# Patient Record
Sex: Female | Born: 1980 | Race: Black or African American | Hispanic: No | Marital: Married | Smoking: Never smoker
Health system: Southern US, Community
[De-identification: ages and names within clinical notes are randomized; demographics above are authoritative.]

## PROBLEM LIST (undated history)

## (undated) DIAGNOSIS — Z8619 Personal history of other infectious and parasitic diseases: Secondary | ICD-10-CM

## (undated) DIAGNOSIS — G43909 Migraine, unspecified, not intractable, without status migrainosus: Secondary | ICD-10-CM

## (undated) DIAGNOSIS — D709 Neutropenia, unspecified: Secondary | ICD-10-CM

## (undated) HISTORY — DX: Personal history of other infectious and parasitic diseases: Z86.19

## (undated) HISTORY — DX: Neutropenia, unspecified: D70.9

## (undated) HISTORY — DX: Migraine, unspecified, not intractable, without status migrainosus: G43.909

---
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
## 2001-08-08 ENCOUNTER — Emergency Department (HOSPITAL_COMMUNITY): Admission: EM | Admit: 2001-08-08 | Discharge: 2001-08-08 | Payer: Self-pay | Admitting: Emergency Medicine

## 2003-09-17 ENCOUNTER — Other Ambulatory Visit: Admission: RE | Admit: 2003-09-17 | Discharge: 2003-09-17 | Payer: Self-pay | Admitting: Internal Medicine

## 2004-11-23 ENCOUNTER — Other Ambulatory Visit: Admission: RE | Admit: 2004-11-23 | Discharge: 2004-11-23 | Payer: Self-pay | Admitting: Internal Medicine

## 2005-11-24 ENCOUNTER — Other Ambulatory Visit: Admission: RE | Admit: 2005-11-24 | Discharge: 2005-11-24 | Payer: Self-pay | Admitting: Internal Medicine

## 2006-03-28 ENCOUNTER — Other Ambulatory Visit: Admission: RE | Admit: 2006-03-28 | Discharge: 2006-03-28 | Payer: Self-pay | Admitting: Obstetrics and Gynecology

## 2006-11-02 ENCOUNTER — Ambulatory Visit: Payer: Self-pay | Admitting: Obstetrics and Gynecology

## 2007-01-24 ENCOUNTER — Ambulatory Visit: Payer: Self-pay | Admitting: Obstetrics and Gynecology

## 2007-01-26 ENCOUNTER — Encounter: Payer: Self-pay | Admitting: Maternal & Fetal Medicine

## 2007-03-28 ENCOUNTER — Observation Stay: Payer: Self-pay | Admitting: Advanced Practice Midwife

## 2007-04-06 ENCOUNTER — Ambulatory Visit: Payer: Self-pay | Admitting: Obstetrics and Gynecology

## 2007-05-18 ENCOUNTER — Encounter: Payer: Self-pay | Admitting: Maternal & Fetal Medicine

## 2007-05-31 ENCOUNTER — Ambulatory Visit: Payer: Self-pay | Admitting: Obstetrics and Gynecology

## 2007-06-01 ENCOUNTER — Ambulatory Visit: Payer: Self-pay | Admitting: Obstetrics and Gynecology

## 2007-06-06 ENCOUNTER — Observation Stay: Payer: Self-pay | Admitting: Obstetrics and Gynecology

## 2007-06-13 ENCOUNTER — Observation Stay: Payer: Self-pay | Admitting: Obstetrics and Gynecology

## 2007-06-16 ENCOUNTER — Inpatient Hospital Stay: Payer: Self-pay | Admitting: Obstetrics and Gynecology

## 2007-10-04 ENCOUNTER — Ambulatory Visit: Payer: Self-pay | Admitting: Internal Medicine

## 2010-01-08 ENCOUNTER — Emergency Department (HOSPITAL_COMMUNITY): Admission: EM | Admit: 2010-01-08 | Discharge: 2010-01-08 | Payer: Self-pay | Admitting: Emergency Medicine

## 2010-08-29 LAB — URINALYSIS, ROUTINE W REFLEX MICROSCOPIC
Bilirubin Urine: NEGATIVE
Ketones, ur: NEGATIVE mg/dL
Protein, ur: NEGATIVE mg/dL
Specific Gravity, Urine: 1.031 — ABNORMAL HIGH (ref 1.005–1.030)

## 2010-08-29 LAB — CBC
HCT: 40.1 % (ref 36.0–46.0)
Hemoglobin: 14 g/dL (ref 12.0–15.0)
MCH: 30.2 pg (ref 26.0–34.0)
MCHC: 35 g/dL (ref 30.0–36.0)
Platelets: 204 10*3/uL (ref 150–400)
RBC: 4.64 MIL/uL (ref 3.87–5.11)
RDW: 12.7 % (ref 11.5–15.5)

## 2010-08-29 LAB — DIFFERENTIAL
Basophils Relative: 1 % (ref 0–1)
Eosinophils Relative: 0 % (ref 0–5)
Lymphocytes Relative: 42 % (ref 12–46)
Monocytes Absolute: 0.3 10*3/uL (ref 0.1–1.0)
Neutrophils Relative %: 49 % (ref 43–77)

## 2010-08-29 LAB — COMPREHENSIVE METABOLIC PANEL
AST: 23 U/L (ref 0–37)
Albumin: 4.4 g/dL (ref 3.5–5.2)
BUN: 13 mg/dL (ref 6–23)
Chloride: 109 mEq/L (ref 96–112)
Creatinine, Ser: 0.85 mg/dL (ref 0.4–1.2)
GFR calc Af Amer: >60 mL/min (ref >60)
GFR calc non Af Amer: >60 mL/min (ref >60)
Potassium: 4 mEq/L (ref 3.5–5.1)
Total Protein: 7.9 g/dL (ref 6.0–8.3)

## 2010-08-29 LAB — POCT PREGNANCY, URINE: Preg Test, Ur: NEGATIVE

## 2011-08-23 ENCOUNTER — Other Ambulatory Visit: Payer: Self-pay | Admitting: Obstetrics & Gynecology

## 2011-08-23 ENCOUNTER — Other Ambulatory Visit (HOSPITAL_COMMUNITY)
Admission: RE | Admit: 2011-08-23 | Discharge: 2011-08-23 | Disposition: A | Discharge status: Home / Self Care | Payer: BC Managed Care – PPO | Source: Ambulatory Visit | Attending: Obstetrics & Gynecology | Admitting: Obstetrics & Gynecology

## 2011-08-23 DIAGNOSIS — Z124 Encounter for screening for malignant neoplasm of cervix: Secondary | ICD-10-CM | POA: Insufficient documentation

## 2011-08-23 DIAGNOSIS — Z1159 Encounter for screening for other viral diseases: Secondary | ICD-10-CM | POA: Insufficient documentation

## 2014-06-10 ENCOUNTER — Ambulatory Visit (INDEPENDENT_AMBULATORY_CARE_PROVIDER_SITE_OTHER): Payer: BC Managed Care – PPO | Admitting: Podiatry

## 2014-06-10 ENCOUNTER — Encounter: Payer: Self-pay | Admitting: Podiatry

## 2014-06-10 VITALS — BP 115/76 | HR 66 | Ht 64.0 in | Wt 169.0 lb

## 2014-06-10 DIAGNOSIS — M7740 Metatarsalgia, unspecified foot: Secondary | ICD-10-CM

## 2014-06-10 DIAGNOSIS — M7742 Metatarsalgia, left foot: Secondary | ICD-10-CM

## 2014-06-10 DIAGNOSIS — M21612 Bunion of left foot: Secondary | ICD-10-CM

## 2014-06-10 DIAGNOSIS — M21969 Unspecified acquired deformity of unspecified lower leg: Secondary | ICD-10-CM

## 2014-06-10 DIAGNOSIS — M21619 Bunion of unspecified foot: Secondary | ICD-10-CM | POA: Insufficient documentation

## 2014-06-10 DIAGNOSIS — M2012 Hallux valgus (acquired), left foot: Secondary | ICD-10-CM

## 2014-06-10 DIAGNOSIS — M2010 Hallux valgus (acquired), unspecified foot: Secondary | ICD-10-CM | POA: Insufficient documentation

## 2014-06-10 NOTE — Progress Notes (Signed)
Subjective: 33 year old female presents complaining of left lateral mid foot area pain after running x 2 months. At work she has a Health and safety inspectordesk job.  Runs three times a week for 30 minutes.   Objective: Vascular: All pedal pulses are palpable. No edema or erythema noted. Dermatologic: Dry peeling skin on right plantar and in web spaces. Neurologic: All epicritic and tactile sensations grossly intact. 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 Elevated first ray left. Lateral weight shifting left. Functional hallux limitus left.  Radiographic examination noted of short first ray bilateral. Hallux valgus on left with enlarged medial eminence of the first ray left.  Anterior break in CYMA line bilateral.  Assessment: Metatarsus primus elevatus. Short first ray bilateral. HAV with bunion left. Lesser metatarsalgia left.   Plan:  Reviewed findings and available treatment options. Both feet casted for Orthotics.

## 2014-06-10 NOTE — Patient Instructions (Signed)
Seen for pain in left foot. Both feet casted for orthotics.

## 2014-08-12 ENCOUNTER — Ambulatory Visit: Payer: Self-pay | Admitting: Podiatry

## 2016-07-15 DIAGNOSIS — Z8619 Personal history of other infectious and parasitic diseases: Secondary | ICD-10-CM

## 2016-07-15 HISTORY — DX: Personal history of other infectious and parasitic diseases: Z86.19

## 2017-04-14 ENCOUNTER — Telehealth: Payer: Self-pay | Admitting: Hematology and Oncology

## 2017-04-14 NOTE — Telephone Encounter (Signed)
Spoke with patient regarding new patient appointment with Dr. Gweneth DimitriPerlov 11/6 @ 2 pm to arrive 1:30 pm. Patient given date/time/location of appointment.   Demographic and insurance information confirmed.

## 2017-04-19 ENCOUNTER — Ambulatory Visit (HOSPITAL_BASED_OUTPATIENT_CLINIC_OR_DEPARTMENT_OTHER): Payer: 59 | Admitting: Hematology and Oncology

## 2017-04-19 ENCOUNTER — Ambulatory Visit (HOSPITAL_BASED_OUTPATIENT_CLINIC_OR_DEPARTMENT_OTHER): Payer: 59

## 2017-04-19 ENCOUNTER — Encounter: Payer: Self-pay | Admitting: Hematology and Oncology

## 2017-04-19 ENCOUNTER — Telehealth: Payer: Self-pay | Admitting: Hematology and Oncology

## 2017-04-19 VITALS — BP 121/66 | HR 67 | Temp 98.7°F | Resp 20 | Ht 64.0 in | Wt 165.5 lb

## 2017-04-19 DIAGNOSIS — R51 Headache: Secondary | ICD-10-CM | POA: Diagnosis not present

## 2017-04-19 DIAGNOSIS — D72819 Decreased white blood cell count, unspecified: Secondary | ICD-10-CM

## 2017-04-19 DIAGNOSIS — H539 Unspecified visual disturbance: Secondary | ICD-10-CM

## 2017-04-19 DIAGNOSIS — R110 Nausea: Secondary | ICD-10-CM

## 2017-04-19 DIAGNOSIS — R5383 Other fatigue: Secondary | ICD-10-CM | POA: Diagnosis not present

## 2017-04-19 DIAGNOSIS — R630 Anorexia: Secondary | ICD-10-CM | POA: Diagnosis not present

## 2017-04-19 DIAGNOSIS — D709 Neutropenia, unspecified: Secondary | ICD-10-CM

## 2017-04-19 DIAGNOSIS — R21 Rash and other nonspecific skin eruption: Secondary | ICD-10-CM | POA: Diagnosis not present

## 2017-04-19 DIAGNOSIS — K5900 Constipation, unspecified: Secondary | ICD-10-CM

## 2017-04-19 LAB — CBC & DIFF AND RETIC
BASO%: 0 % (ref 0.0–2.0)
BASOS ABS: 0 10*3/uL (ref 0.0–0.1)
EOS ABS: 0 10*3/uL (ref 0.0–0.5)
EOS%: 0 % (ref 0.0–7.0)
HEMATOCRIT: 37.3 % (ref 34.8–46.6)
HEMOGLOBIN: 12.7 g/dL (ref 11.6–15.9)
Immature Retic Fract: 2.1 % (ref 1.60–10.00)
LYMPH%: 42.9 % (ref 14.0–49.7)
MCH: 29.2 pg (ref 25.1–34.0)
MCHC: 34 g/dL (ref 31.5–36.0)
MCV: 85.7 fL (ref 79.5–101.0)
MONO#: 0.2 10*3/uL (ref 0.1–0.9)
MONO%: 6 % (ref 0.0–14.0)
NEUT#: 2 10*3/uL (ref 1.5–6.5)
NEUT%: 51.1 % (ref 38.4–76.8)
Platelets: 192 10*3/uL (ref 145–400)
RBC: 4.35 10*6/uL (ref 3.70–5.45)
RDW: 12.4 % (ref 11.2–14.5)
Retic %: 1.37 % (ref 0.70–2.10)
Retic Ct Abs: 59.6 10*3/uL (ref 33.70–90.70)
WBC: 4 10*3/uL (ref 3.9–10.3)
lymph#: 1.7 10*3/uL (ref 0.9–3.3)

## 2017-04-19 LAB — MORPHOLOGY: PLT EST: ADEQUATE

## 2017-04-19 LAB — COMPREHENSIVE METABOLIC PANEL
ALBUMIN: 4.1 g/dL (ref 3.5–5.0)
ALK PHOS: 56 U/L (ref 40–150)
ALT: 11 U/L (ref 0–55)
ANION GAP: 5 meq/L (ref 3–11)
AST: 19 U/L (ref 5–34)
BILIRUBIN TOTAL: 0.38 mg/dL (ref 0.20–1.20)
BUN: 10.9 mg/dL (ref 7.0–26.0)
CALCIUM: 9.3 mg/dL (ref 8.4–10.4)
CO2: 28 meq/L (ref 22–29)
CREATININE: 0.9 mg/dL (ref 0.6–1.1)
Chloride: 106 mEq/L (ref 98–109)
Glucose: 106 mg/dl (ref 70–140)
Potassium: 3.3 mEq/L — ABNORMAL LOW (ref 3.5–5.1)
Sodium: 140 mEq/L (ref 136–145)
TOTAL PROTEIN: 7.6 g/dL (ref 6.4–8.3)

## 2017-04-19 LAB — LACTATE DEHYDROGENASE: LDH: 151 U/L (ref 125–245)

## 2017-04-19 NOTE — Telephone Encounter (Signed)
Scheduled appt pe r1/16 los - Gave patient AVS and calender per los.  

## 2017-04-20 LAB — ANCA TITERS

## 2017-04-20 LAB — RHEUMATOID FACTOR: RA Latex Turbid.: <10 IU/mL (ref 0.0–13.9)

## 2017-04-20 LAB — ANTI-DNA ANTIBODY, DOUBLE-STRANDED: dsDNA Ab: 2 IU/mL (ref 0–9)

## 2017-04-21 LAB — ANTINUCLEAR ANTIBODIES, IFA: ANTINUCLEAR ANTIBODIES, IFA: NEGATIVE

## 2017-04-27 ENCOUNTER — Other Ambulatory Visit: Payer: Self-pay

## 2017-04-27 ENCOUNTER — Telehealth: Payer: Self-pay | Admitting: Hematology and Oncology

## 2017-04-27 ENCOUNTER — Encounter: Payer: Self-pay | Admitting: Hematology and Oncology

## 2017-04-27 ENCOUNTER — Ambulatory Visit (HOSPITAL_BASED_OUTPATIENT_CLINIC_OR_DEPARTMENT_OTHER): Payer: 59 | Admitting: Hematology and Oncology

## 2017-04-27 VITALS — BP 125/73 | HR 58 | Temp 98.1°F | Resp 18 | Ht 64.0 in | Wt 163.6 lb

## 2017-04-27 DIAGNOSIS — R21 Rash and other nonspecific skin eruption: Secondary | ICD-10-CM | POA: Diagnosis not present

## 2017-04-27 DIAGNOSIS — D72819 Decreased white blood cell count, unspecified: Secondary | ICD-10-CM | POA: Diagnosis not present

## 2017-04-27 DIAGNOSIS — H539 Unspecified visual disturbance: Secondary | ICD-10-CM | POA: Diagnosis not present

## 2017-04-27 DIAGNOSIS — D709 Neutropenia, unspecified: Secondary | ICD-10-CM | POA: Diagnosis not present

## 2017-04-27 DIAGNOSIS — K5900 Constipation, unspecified: Secondary | ICD-10-CM | POA: Diagnosis not present

## 2017-04-27 DIAGNOSIS — R630 Anorexia: Secondary | ICD-10-CM

## 2017-04-27 DIAGNOSIS — R5383 Other fatigue: Secondary | ICD-10-CM

## 2017-04-27 DIAGNOSIS — R51 Headache: Secondary | ICD-10-CM

## 2017-04-27 DIAGNOSIS — R110 Nausea: Secondary | ICD-10-CM

## 2017-04-27 NOTE — Telephone Encounter (Signed)
Gave avs and calendar for February 2019 °

## 2017-04-28 NOTE — Progress Notes (Signed)
[facility] New Visit:  Assessment: Neutropenia [facility]) 36 y.o. 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  The exact nature of the decline and which branch of the blood counts is diminished is unclear at this time based on the available labs.  We will start our evaluation by repeating blood work today.  We will expand the obtain lab work to include testing for autoimmune conditions based on presence of cutaneous rash and history of eczema.  Plan:  --Repeat labs today --Return to clinic 1 week to review  Voice recognition software was used and creation of this note. Despite my best effort at editing the text, some misspelling/errors may have occurred.  Orders Placed This Encounter  Procedures  . CBC & Diff and Retic    Standing Status:   Future    Number of Occurrences:   1    Standing Expiration Date:   04/19/2018  . Morphology    Standing Status:   Future    Number of Occurrences:   1    Standing Expiration Date:   04/19/2018  . Lactate dehydrogenase (LDH)    Standing Status:   Future    Number of Occurrences:   1    Standing Expiration Date:   04/19/2018  . Comprehensive metabolic panel    Standing Status:   Future    Number of Occurrences:   1    Standing Expiration Date:   04/19/2018  . ANA, IFA (with reflex)    Standing Status:   Future    Number of Occurrences:   1    Standing Expiration Date:   04/19/2018  . Rheumatoid factor    Standing Status:   Future    Number of Occurrences:   1    Standing Expiration Date:   04/19/2018  . Anti-DNA antibody, double-stranded    Standing Status:   Future    Number of Occurrences:   1    Standing Expiration Date:   04/19/2018  . ANCA Titers    Standing Status:   Future    Number of Occurrences:   1    Standing Expiration Date:   04/19/2018    All questions were answered. . The patient knows to call the clinic  with any problems, questions or concerns.  This note was electronically signed.    History of Presenting Illness Michelle [name] 36 y.o. presenting to the [facility] for evaluation of leukopenia, referred by Dr. Deland Pretty.  Past medical history is significant for eczema and migraines.  Patient has multiple complaints including 3 of left-sided facial rash that has developed in February 2018 and was thought to represent shingles exacerbation.  Treated with prednisone and improved.  Patient reports some decrease in vision in the right eye, decreased appetite, fatigue, recurrent nausea and constipation with associated headaches.  Denies fevers, chills, night sweats.  No oral sores.  No known sun sensitivity or skin blistering.  Patient denies chest pain, shortness of breath or cough.  No abdominal pain.  Denies dysuria or hematuria.  Oncological/hematological History: --Labs, 01/08/10: WBC 3.0, ANC 1.5, ALC 1.3, Mono 0.3, Eos 0, Baso 0, Hgb 14.0,  Plt 204;  --Labs, 04/02/16: WBC 3.5,                                                                         Hgb 13.3, MCV 88.4, MCH 29.2, RDW 12.2, Plt 232; --Labs, 04/06/17: WBC 2.5,                                                                         Hgb 13.2, MCV 86.0, MCH 28.9, RDW 12.3, Plt 232;   Medical History: History reviewed. No pertinent past medical history.  Surgical History: Past Surgical History:  Procedure Laterality Date  . BREAST LUMPECTOMY  1994    Family History: History reviewed. No pertinent family history.  Social History: Social History   Socioeconomic History  . Marital status: Married    Spouse name: Not on file  . Number of children: Not on file  . Years of education: Not on file  . Highest education level: Not on file  Social Needs  . Financial resource strain: Not on file  . Food insecurity - worry: Not on file  . Food insecurity - inability:  Not on file  . Transportation needs - medical: Not on file  . Transportation needs - non-medical: Not on file  Occupational History  . Not on file  Tobacco Use  . Smoking status: Never Smoker  . Smokeless tobacco: Never Used  Substance and Sexual Activity  . Alcohol use: No    Alcohol/week: 0.0 oz    Frequency: Never  . Drug use: No  . Sexual activity: Yes    Birth control/protection: IUD  Other Topics Concern  . Not on file  Social History Narrative  . Not on file    Allergies: Allergies  Allergen Reactions  . Codeine     Medications:  No current outpatient medications on file.   No current facility-administered medications for this visit.     Review of Systems: Review of Systems  Constitutional: Positive for appetite change and fatigue.  Eyes: Positive for eye problems.  Gastrointestinal: Positive for constipation and nausea.  Skin: Positive for rash.  Neurological: Positive for headaches.  All other systems reviewed and are negative.    PHYSICAL EXAMINATION Blood pressure 121/66, pulse 67, temperature 98.7 F (37.1 C), temperature source Oral, resp. rate 20, height 5' 4"  (1.626 m), weight 165 lb 8 oz (75.1 kg), SpO2 100 %.  ECOG PERFORMANCE STATUS: 2 - Symptomatic, <50% confined to bed  Physical Exam  Constitutional: She is oriented to person, place, and time and well-developed, well-nourished, and in no distress. No distress.  HENT:  Head: Normocephalic and atraumatic.  Mouth/Throat: Oropharynx is clear and moist. No oropharyngeal exudate.  Eyes: Conjunctivae and EOM are normal. Pupils are equal, round, and reactive to light. No scleral icterus.  Neck: No thyromegaly present.  Cardiovascular: Normal rate and regular rhythm.  No murmur heard. Pulmonary/Chest: Effort normal and breath sounds normal. No respiratory distress. She has no wheezes.  She has no rales.  Abdominal: Soft. Bowel sounds are normal. She exhibits no distension. There is no tenderness.   Musculoskeletal: She exhibits no edema.  Lymphadenopathy:    She has no cervical adenopathy.  Neurological: She is alert and oriented to person, place, and time. She has normal reflexes. No cranial nerve deficit.  Skin: Skin is warm and dry. No rash noted. She is not diaphoretic.     LABORATORY DATA: I have personally reviewed the data as listed: Appointment on 04/19/2017  Component Date Value Ref Range Status  . Cytoplasmic (C-ANCA) 04/19/2017 <1:20  Neg:<1:20 titer Final  . Perinuclear (P-ANCA) 04/19/2017 <1:20  Neg:<1:20 titer Final   Comment: The presence of positive fluorescence exhibiting P-ANCA or C-ANCA patterns alone is not specific for the diagnosis of Wegener's Granulomatosis (WG) or microscopic polyangiitis. Decisions about treatment should not be based solely on ANCA IFA results.  The International ANCA Group Consensus recommends follow up testing of positive sera with both PR-3 and MPO-ANCA enzyme immunoassays. As many as 5% serum samples are positive only by EIA. Ref. AM J Clin Pathol 1999;111:507-513.   Marland Kitchen Atypical pANCA 04/19/2017 <1:20  Neg:<1:20 titer Final   Comment: The atypical pANCA pattern has been observed in a significant percentage of patients with ulcerative colitis, primary sclerosing cholangitis and autoimmune hepatitis.   Marland Kitchen dsDNA Ab 04/19/2017 2  0 - 9 IU/mL Final   Comment:                                                   Negative      <5                                                   Equivocal  5 - 9                                                   Positive      >9   . RA Latex Turbid. 04/19/2017 <10.0  0.0 - 13.9 IU/mL Final  . ANA Titer 1 04/19/2017 Negative   Final   Comment:                                                     Negative   <1:80                                                     Borderline  1:80  Positive   >1:80   . Sodium 04/19/2017 140  136 - 145 mEq/L Final  .  Potassium 04/19/2017 3.3* 3.5 - 5.1 mEq/L Final  . Chloride 04/19/2017 106  98 - 109 mEq/L Final  . CO2 04/19/2017 28  22 - 29 mEq/L Final  . Glucose 04/19/2017 106  70 - 140 mg/dl Final   Glucose reference range is for nonfasting patients. Fasting glucose reference range is 70- 100.  Marland Kitchen BUN 04/19/2017 10.9  7.0 - 26.0 mg/dL Final  . Creatinine 04/19/2017 0.9  0.6 - 1.1 mg/dL Final  . Total Bilirubin 04/19/2017 0.38  0.20 - 1.20 mg/dL Final  . Alkaline Phosphatase 04/19/2017 56  40 - 150 U/L Final  . AST 04/19/2017 19  5 - 34 U/L Final  . ALT 04/19/2017 11  0 - 55 U/L Final  . Total Protein 04/19/2017 7.6  6.4 - 8.3 g/dL Final  . Albumin 04/19/2017 4.1  3.5 - 5.0 g/dL Final  . Calcium 04/19/2017 9.3  8.4 - 10.4 mg/dL Final  . Anion Gap 04/19/2017 5  3 - 11 mEq/L Final  . EGFR 04/19/2017 >60  >60 ml/min/1.73 m2 Final   eGFR is calculated using the CKD-EPI Creatinine Equation (2009)  . LDH 04/19/2017 151  125 - 245 U/L Final  . Tear Drop Cells 04/19/2017 occ  Negative Final  . Ovalocytes 04/19/2017 occ  Negative Final  . White Cell Comments 04/19/2017 C/W auto diff   Final  . PLT EST 04/19/2017 Adequate  Adequate Final  . WBC 04/19/2017 4.0  3.9 - 10.3 10e3/uL Final  . NEUT# 04/19/2017 2.0  1.5 - 6.5 10e3/uL Final  . HGB 04/19/2017 12.7  11.6 - 15.9 g/dL Final  . HCT 04/19/2017 37.3  34.8 - 46.6 % Final  . Platelets 04/19/2017 192  145 - 400 10e3/uL Final  . MCV 04/19/2017 85.7  79.5 - 101.0 fL Final  . MCH 04/19/2017 29.2  25.1 - 34.0 pg Final  . MCHC 04/19/2017 34.0  31.5 - 36.0 g/dL Final  . RBC 04/19/2017 4.35  3.70 - 5.45 10e6/uL Final  . RDW 04/19/2017 12.4  11.2 - 14.5 % Final  . lymph# 04/19/2017 1.7  0.9 - 3.3 10e3/uL Final  . MONO# 04/19/2017 0.2  0.1 - 0.9 10e3/uL Final  . Eosinophils Absolute 04/19/2017 0.0  0.0 - 0.5 10e3/uL Final  . Basophils Absolute 04/19/2017 0.0  0.0 - 0.1 10e3/uL Final  . NEUT% 04/19/2017 51.1  38.4 - 76.8 % Final  . LYMPH% 04/19/2017 42.9  14.0 -  49.7 % Final  . MONO% 04/19/2017 6.0  0.0 - 14.0 % Final  . EOS% 04/19/2017 0.0  0.0 - 7.0 % Final  . BASO% 04/19/2017 0.0  0.0 - 2.0 % Final  . Retic % 04/19/2017 1.37  0.70 - 2.10 % Final  . Retic Ct Abs 04/19/2017 59.60  33.70 - 90.70 10e3/uL Final  . Immature Retic Fract 04/19/2017 2.10  1.60 - 10.00 % Final         Ardath Sax, MD

## 2017-04-28 NOTE — Assessment & Plan Note (Addendum)
36 y.o. 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  The exact nature of the decline and which branch of the blood counts is diminished is unclear at this time based on the available labs.  We will start our evaluation by repeating blood work today.  We will expand the obtain lab work to include testing for autoimmune conditions based on presence of cutaneous rash and history of eczema.  Plan:  --Repeat labs today --Return to clinic 1 week to review

## 2017-05-07 NOTE — Progress Notes (Signed)
[facility] Follow-up Visit:  Assessment: Neutropenia [facility]) 36 y.o. 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  The exact nature of the decline and which branch of the blood counts is diminished is unclear at this time based on the available labs.  Repeat blood work demonstrates normalization of the white blood cell count and neutrophil counts at this time.  Hemoglobin and platelet counts are also within normal ranges.  Initial panel reveals no evidence of autoimmune disease markers at this time.  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  Nevertheless, currently abnormalities are resolved and absence of additional hematological abnormalities, I do hesitate to proceed with any additional assessment.  Instead, we will initiate observation and proceed with bone marrow biopsy if red blood cell or platelet lineage show significant abnormality in the future  Plan:  --Return to clinic 3 months with labs for continued hematological monitoring  Voice recognition software was used and creation of this note. Despite my best effort at editing the text, some misspelling/errors may have occurred.  Orders Placed This Encounter  Procedures  . CBC with Differential    Standing Status:   Future    Standing Expiration Date:   04/27/2018   All questions were answered.  . The patient knows to call the clinic with any problems, questions or concerns.  This note was electronically signed.    History of Presenting Illness Michelle [name] is a 36 y.o. [name] followed in the [facility] for evaluation of leukopenia, referred by Dr. Deland Pretty.  Past medical history is significant for eczema and migraines.  Patient has multiple complaints including 3 of left-sided facial rash that has developed in February 2018 and was thought to  represent shingles exacerbation.  Treated with prednisone and improved.  Patient reports some decrease in vision in the right eye, decreased appetite, fatigue, recurrent nausea and constipation with associated headaches.  Denies fevers, chills, night sweats.  No oral sores.  No known sun sensitivity or skin blistering.  Patient denies chest pain, shortness of breath or cough.  No abdominal pain.  Denies dysuria or hematuria.  Oncological/hematological History: --Labs, 01/08/10: WBC 3.0, ANC 1.5, ALC 1.3, Mono 0.3, Eos 0, Baso 0, Hgb 14.0,                                                        Plt 204;  --Labs, 04/02/16: WBC 3.5,                                                                         Hgb 13.3, MCV 88.4, MCH 29.2, RDW 12.2, Plt 232; --Labs, 04/06/17: WBC 2.5,  Hgb 13.2, MCV 86.0, MCH 28.9, RDW 12.3, Plt 232; --Labs, 04/19/17: WBC 4.0, ANC 2.0, ALC 1.7, Mono 0.2, Eos 0, Baso 0, Hgb 12.7,                                                        Plt 192; LDH 151; ANA, RF, anti-dsDNA Ab, & ANCA negative.   Medical History: No past medical history on file.  Surgical History: Past Surgical History:  Procedure Laterality Date  . BREAST LUMPECTOMY  1994    Family History: No family history on file.  Social History: Social History   Socioeconomic History  . Marital status: Married    Spouse name: Not on file  . Number of children: Not on file  . Years of education: Not on file  . Highest education level: Not on file  Social Needs  . Financial resource strain: Not on file  . Food insecurity - worry: Not on file  . Food insecurity - inability: Not on file  . Transportation needs - medical: Not on file  . Transportation needs - non-medical: Not on file  Occupational History  . Not on file  Tobacco Use  . Smoking status: Never Smoker  . Smokeless tobacco: Never Used  Substance and Sexual Activity  .  Alcohol use: No    Alcohol/week: 0.0 oz    Frequency: Never  . Drug use: No  . Sexual activity: Yes    Birth control/protection: IUD  Other Topics Concern  . Not on file  Social History Narrative  . Not on file    Allergies: Allergies  Allergen Reactions  . Codeine     Medications:  No current outpatient medications on file.   No current facility-administered medications for this visit.     Review of Systems: Review of Systems  Constitutional: Positive for appetite change and fatigue.  Eyes: Positive for eye problems.  Gastrointestinal: Positive for constipation and nausea.  Skin: Positive for rash.  Neurological: Positive for headaches.  All other systems reviewed and are negative.    PHYSICAL EXAMINATION Blood pressure 125/73, pulse (!) 58, temperature 98.1 F (36.7 C), temperature source Oral, resp. rate 18, height 5' 4"  (1.626 m), weight 163 lb 9.6 oz (74.2 kg), SpO2 100 %.  ECOG PERFORMANCE STATUS: 0 - Asymptomatic  Physical Exam  Constitutional: She is oriented to person, place, and time and well-developed, well-nourished, and in no distress. No distress.  HENT:  Head: Normocephalic and atraumatic.  Mouth/Throat: Oropharynx is clear and moist. No oropharyngeal exudate.  Eyes: Conjunctivae and EOM are normal. Pupils are equal, round, and reactive to light. No scleral icterus.  Neck: No thyromegaly present.  Cardiovascular: Normal rate and regular rhythm.  No murmur heard. Pulmonary/Chest: Effort normal and breath sounds normal. No respiratory distress. She has no wheezes. She has no rales.  Abdominal: Soft. Bowel sounds are normal. She exhibits no distension. There is no tenderness.  Musculoskeletal: She exhibits no edema.  Lymphadenopathy:    She has no cervical adenopathy.  Neurological: She is alert and oriented to person, place, and time. She has normal reflexes. No cranial nerve deficit.  Skin: Skin is warm and dry. No rash noted. She is not  diaphoretic.     LABORATORY DATA: I have personally reviewed the data as listed: No visits with results  within 1 Week(s) from this visit.  Latest known visit with results is:  Appointment on 04/19/2017  Component Date Value Ref Range Status  . Cytoplasmic (C-ANCA) 04/19/2017 <1:20  Neg:<1:20 titer Final  . Perinuclear (P-ANCA) 04/19/2017 <1:20  Neg:<1:20 titer Final   Comment: The presence of positive fluorescence exhibiting P-ANCA or C-ANCA patterns alone is not specific for the diagnosis of Wegener's Granulomatosis (WG) or microscopic polyangiitis. Decisions about treatment should not be based solely on ANCA IFA results.  The International ANCA Group Consensus recommends follow up testing of positive sera with both PR-3 and MPO-ANCA enzyme immunoassays. As many as 5% serum samples are positive only by EIA. Ref. AM J Clin Pathol 1999;111:507-513.   Marland Kitchen Atypical pANCA 04/19/2017 <1:20  Neg:<1:20 titer Final   Comment: The atypical pANCA pattern has been observed in a significant percentage of patients with ulcerative colitis, primary sclerosing cholangitis and autoimmune hepatitis.   Marland Kitchen dsDNA Ab 04/19/2017 2  0 - 9 IU/mL Final   Comment:                                                   Negative      <5                                                   Equivocal  5 - 9                                                   Positive      >9   . RA Latex Turbid. 04/19/2017 <10.0  0.0 - 13.9 IU/mL Final  . ANA Titer 1 04/19/2017 Negative   Final   Comment:                                                     Negative   <1:80                                                     Borderline  1:80                                                     Positive   >1:80   . Sodium 04/19/2017 140  136 - 145 mEq/L Final  . Potassium 04/19/2017 3.3* 3.5 - 5.1 mEq/L Final  . Chloride 04/19/2017 106  98 - 109 mEq/L Final  . CO2 04/19/2017 28  22 - 29 mEq/L Final  . Glucose 04/19/2017 106  70 - 140 mg/dl  Final   Glucose reference range is for nonfasting patients.  Fasting glucose reference range is 70- 100.  Marland Kitchen BUN 04/19/2017 10.9  7.0 - 26.0 mg/dL Final  . Creatinine 04/19/2017 0.9  0.6 - 1.1 mg/dL Final  . Total Bilirubin 04/19/2017 0.38  0.20 - 1.20 mg/dL Final  . Alkaline Phosphatase 04/19/2017 56  40 - 150 U/L Final  . AST 04/19/2017 19  5 - 34 U/L Final  . ALT 04/19/2017 11  0 - 55 U/L Final  . Total Protein 04/19/2017 7.6  6.4 - 8.3 g/dL Final  . Albumin 04/19/2017 4.1  3.5 - 5.0 g/dL Final  . Calcium 04/19/2017 9.3  8.4 - 10.4 mg/dL Final  . Anion Gap 04/19/2017 5  3 - 11 mEq/L Final  . EGFR 04/19/2017 >60  >60 ml/min/1.73 m2 Final   eGFR is calculated using the CKD-EPI Creatinine Equation (2009)  . LDH 04/19/2017 151  125 - 245 U/L Final  . Tear Drop Cells 04/19/2017 occ  Negative Final  . Ovalocytes 04/19/2017 occ  Negative Final  . White Cell Comments 04/19/2017 C/W auto diff   Final  . PLT EST 04/19/2017 Adequate  Adequate Final  . WBC 04/19/2017 4.0  3.9 - 10.3 10e3/uL Final  . NEUT# 04/19/2017 2.0  1.5 - 6.5 10e3/uL Final  . HGB 04/19/2017 12.7  11.6 - 15.9 g/dL Final  . HCT 04/19/2017 37.3  34.8 - 46.6 % Final  . Platelets 04/19/2017 192  145 - 400 10e3/uL Final  . MCV 04/19/2017 85.7  79.5 - 101.0 fL Final  . MCH 04/19/2017 29.2  25.1 - 34.0 pg Final  . MCHC 04/19/2017 34.0  31.5 - 36.0 g/dL Final  . RBC 04/19/2017 4.35  3.70 - 5.45 10e6/uL Final  . RDW 04/19/2017 12.4  11.2 - 14.5 % Final  . lymph# 04/19/2017 1.7  0.9 - 3.3 10e3/uL Final  . MONO# 04/19/2017 0.2  0.1 - 0.9 10e3/uL Final  . Eosinophils Absolute 04/19/2017 0.0  0.0 - 0.5 10e3/uL Final  . Basophils Absolute 04/19/2017 0.0  0.0 - 0.1 10e3/uL Final  . NEUT% 04/19/2017 51.1  38.4 - 76.8 % Final  . LYMPH% 04/19/2017 42.9  14.0 - 49.7 % Final  . MONO% 04/19/2017 6.0  0.0 - 14.0 % Final  . EOS% 04/19/2017 0.0  0.0 - 7.0 % Final  . BASO% 04/19/2017 0.0  0.0 - 2.0 % Final  . Retic % 04/19/2017 1.37  0.70 -  2.10 % Final  . Retic Ct Abs 04/19/2017 59.60  33.70 - 90.70 10e3/uL Final  . Immature Retic Fract 04/19/2017 2.10  1.60 - 10.00 % Final       Ardath Sax, MD

## 2017-05-07 NOTE — Assessment & Plan Note (Signed)
36 y.o. 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  The exact nature of the decline and which branch of the blood counts is diminished is unclear at this time based on the available labs.  Repeat blood work demonstrates normalization of the white blood cell count and neutrophil counts at this time.  Hemoglobin and platelet counts are also within normal ranges.  Initial panel reveals no evidence of autoimmune disease markers at this time.  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  Nevertheless, currently abnormalities are resolved and absence of additional hematological abnormalities, I do hesitate to proceed with any additional assessment.  Instead, we will initiate observation and proceed with bone marrow biopsy if red blood cell or platelet lineage show significant abnormality in the future  Plan:  --Return to clinic 3 months with labs for continued hematological monitoring

## 2017-07-28 ENCOUNTER — Inpatient Hospital Stay: Payer: 59

## 2017-07-28 ENCOUNTER — Encounter: Payer: Self-pay | Admitting: Hematology and Oncology

## 2017-07-28 ENCOUNTER — Inpatient Hospital Stay: Payer: 59 | Attending: Hematology and Oncology | Admitting: Hematology and Oncology

## 2017-07-28 ENCOUNTER — Telehealth: Payer: Self-pay

## 2017-07-28 VITALS — BP 112/66 | HR 55 | Temp 98.9°F | Resp 18 | Ht 64.0 in | Wt 165.7 lb

## 2017-07-28 DIAGNOSIS — H02403 Unspecified ptosis of bilateral eyelids: Secondary | ICD-10-CM | POA: Insufficient documentation

## 2017-07-28 DIAGNOSIS — D709 Neutropenia, unspecified: Secondary | ICD-10-CM | POA: Diagnosis not present

## 2017-07-28 DIAGNOSIS — D72819 Decreased white blood cell count, unspecified: Secondary | ICD-10-CM | POA: Diagnosis present

## 2017-07-28 LAB — CBC WITH DIFFERENTIAL/PLATELET
BASOS ABS: 0 10*3/uL (ref 0.0–0.1)
Basophils Relative: 0 %
EOS ABS: 0 10*3/uL (ref 0.0–0.5)
Eosinophils Relative: 0 %
HCT: 38.7 % (ref 34.8–46.6)
Hemoglobin: 12.8 g/dL (ref 11.6–15.9)
LYMPHS ABS: 1.4 10*3/uL (ref 0.9–3.3)
LYMPHS PCT: 49 %
MCH: 29 pg (ref 25.1–34.0)
MCHC: 33.1 g/dL (ref 31.5–36.0)
MCV: 87.6 fL (ref 79.5–101.0)
MONO ABS: 0.3 10*3/uL (ref 0.1–0.9)
Monocytes Relative: 10 %
Neutro Abs: 1.2 10*3/uL — ABNORMAL LOW (ref 1.5–6.5)
Neutrophils Relative %: 41 %
PLATELETS: 208 10*3/uL (ref 145–400)
RBC: 4.42 MIL/uL (ref 3.70–5.45)
RDW: 12.5 % (ref 11.2–14.5)
WBC: 2.9 10*3/uL — AB (ref 3.9–10.3)

## 2017-07-28 NOTE — Progress Notes (Signed)
[facility] Cancer Follow-up Visit:  Assessment: Neutropenia [facility]) 37 y.o. 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  The exact nature of the decline and which branch of the blood counts is diminished is unclear at this time based on the available labs.  There has been interval decline in the neutrophil count with mild neutropenia observed on evaluation today.  No changes in the hemoglobin or platelet counts. 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  Patient does not appear to have any significant symptoms associated with neutropenia at this time.  Of interest, patient does have complaint of heaviness in bilateral eyelids, although without apparent fatigability.  This is concerning for possible early development of myasthenia gravis and needs to be further evaluated by appropriate specialist.  Plan:  -Consult neurology for evaluation for possible myasthenia gravis -Monthly labs to continue monitoring leukocyte count trajectory. - Return to clinic in 3 months for continued hematological monitoring.  Voice recognition software was used and creation of this note. Despite my best effort at editing the text, some misspelling/errors may have occurred.  Orders Placed This Encounter  Procedures  . CBC with Differential (Cancer Center Only)    Standing Status:   Standing    Number of Occurrences:   3    Standing Expiration Date:   07/28/2018  . CMP (Cancer Center only)    Standing Status:   Future    Standing Expiration Date:   07/28/2018  . Lactate dehydrogenase (LDH)    Standing Status:   Future    Standing Expiration Date:   07/28/2018  . Ambulatory referral to Neurology    Referral Priority:   Routine    Referral Type:   Consultation    Referral Reason:   Specialty Services Required    Requested Specialty:   Neurology    Number of Visits Requested:   1   All questions were answered.  . The patient knows to call the clinic with any problems, questions or concerns.  This note was electronically signed.    History of Presenting Illness Michelle [name] is a 37 y.o. female followed in the Cancer Center for evaluation of leukopenia, referred by Dr. Merri Brunette.  Past medical history is significant for eczema and migraines.   At this visit to the clinic, patient denies any previous complaints that she had during our last visit.  Her main complaints are generalized fatigue as well as difficulties with keeping her eyelids open.  The eyelids feel heavy and often droop down the impairing her vision.  This may occur intermittently throughout the day.  She has to blink her eyes multiple times to clear the vision.  Denies worsening of the problem towards the end of the day.  Denies other muscle weakness or involuntary muscle movements. No interval fever, chills, night sweats.  No interval infections.   Oncological/hematological History: --Labs, 01/08/10: WBC 3.0, ANC 1.5, ALC 1.3, Mono 0.3, Eos 0, Baso 0, Hgb 14.0,                                                        Plt 204;  --Labs, 04/02/16: WBC 3.5,  Hgb 13.3, MCV 88.4, MCH 29.2, RDW 12.2, Plt 232; --Labs, 04/06/17: WBC 2.5,                                                                         Hgb 13.2, MCV 86.0, MCH 28.9, RDW 12.3, Plt 232; --Labs, 04/19/17: WBC 4.0, ANC 2.0, ALC 1.7, Mono 0.2, Eos 0, Baso 0, Hgb 12.7,                                                        Plt 192; LDH 151; ANA, RF, anti-dsDNA Ab, & ANCA negative. --Labs, 07/28/17: WBC 2.9, ANC 1.2, ALC 1.4, Mono 0.3,             Hgb 12.8,              Plt 208;   Medical History: No past medical history on file.  Surgical History: Past Surgical History:  Procedure Laterality Date  . BREAST LUMPECTOMY  1994    Family  History: No family history on file.  Social History: Social History   Socioeconomic History  . Marital status: Married    Spouse name: Not on file  . Number of children: Not on file  . Years of education: Not on file  . Highest education level: Not on file  Social Needs  . Financial resource strain: Not on file  . Food insecurity - worry: Not on file  . Food insecurity - inability: Not on file  . Transportation needs - medical: Not on file  . Transportation needs - non-medical: Not on file  Occupational History  . Not on file  Tobacco Use  . Smoking status: Never Smoker  . Smokeless tobacco: Never Used  Substance and Sexual Activity  . Alcohol use: No    Alcohol/week: 0.0 oz    Frequency: Never  . Drug use: No  . Sexual activity: Yes    Birth control/protection: IUD  Other Topics Concern  . Not on file  Social History Narrative  . Not on file    Allergies: Allergies  Allergen Reactions  . Codeine     Medications:  No current outpatient medications on file.   No current facility-administered medications for this visit.     Review of Systems: Review of Systems  Constitutional: Positive for fatigue.  Eyes: Positive for eye problems.  All other systems reviewed and are negative.    PHYSICAL EXAMINATION Blood pressure 112/66, pulse (!) 55, temperature 98.9 F (37.2 C), temperature source Oral, resp. rate 18, height 5\' 4"  (1.626 m), weight 165 lb 11.2 oz (75.2 kg), SpO2 100 %.  ECOG PERFORMANCE STATUS: 1 - Symptomatic but completely ambulatory  Physical Exam  Constitutional: She is oriented to person, place, and time and well-developed, well-nourished, and in no distress. No distress.  HENT:  Head: Normocephalic and atraumatic.  Mouth/Throat: Oropharynx is clear and moist. No oropharyngeal exudate.  Eyes: Conjunctivae and EOM are normal. Pupils are equal, round, and reactive to light. No scleral icterus.  Neck: No thyromegaly present.  Cardiovascular:  Normal rate  and regular rhythm.  No murmur heard. Pulmonary/Chest: Effort normal and breath sounds normal. No respiratory distress. She has no wheezes. She has no rales.  Abdominal: Soft. Bowel sounds are normal. She exhibits no distension. There is no tenderness.  Musculoskeletal: She exhibits no edema.  Lymphadenopathy:    She has no cervical adenopathy.  Neurological: She is alert and oriented to person, place, and time. She has normal reflexes. No cranial nerve deficit.  Skin: Skin is warm and dry. No rash noted. She is not diaphoretic.     LABORATORY DATA: I have personally reviewed the data as listed: Appointment on 07/28/2017  Component Date Value Ref Range Status  . WBC 07/28/2017 2.9* 3.9 - 10.3 K/uL Final  . RBC 07/28/2017 4.42  3.70 - 5.45 MIL/uL Final  . Hemoglobin 07/28/2017 12.8  11.6 - 15.9 g/dL Final  . HCT 16/03/9603 38.7  34.8 - 46.6 % Final  . MCV 07/28/2017 87.6  79.5 - 101.0 fL Final  . MCH 07/28/2017 29.0  25.1 - 34.0 pg Final  . MCHC 07/28/2017 33.1  31.5 - 36.0 g/dL Final  . RDW 54/02/8118 12.5  11.2 - 14.5 % Final  . Platelets 07/28/2017 208  145 - 400 K/uL Final  . Neutrophils Relative % 07/28/2017 41  % Final  . Neutro Abs 07/28/2017 1.2* 1.5 - 6.5 K/uL Final  . Lymphocytes Relative 07/28/2017 49  % Final  . Lymphs Abs 07/28/2017 1.4  0.9 - 3.3 K/uL Final  . Monocytes Relative 07/28/2017 10  % Final  . Monocytes Absolute 07/28/2017 0.3  0.1 - 0.9 K/uL Final  . Eosinophils Relative 07/28/2017 0  % Final  . Eosinophils Absolute 07/28/2017 0.0  0.0 - 0.5 K/uL Final  . Basophils Relative 07/28/2017 0  % Final  . Basophils Absolute 07/28/2017 0.0  0.0 - 0.1 K/uL Final   Performed at [facility], [address] [place], [place] [address]       Daisy Blossom, MD

## 2017-07-28 NOTE — Telephone Encounter (Signed)
Printed avs and calender of upcoming appointment, and assured referral went [facility]. Per 2/14 los

## 2017-07-28 NOTE — Assessment & Plan Note (Signed)
37 y.o. 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  The exact nature of the decline and which branch of the blood counts is diminished is unclear at this time based on the available labs.  There has been interval decline in the neutrophil count with mild neutropenia observed on evaluation today.  No changes in the hemoglobin or platelet counts. 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  Patient does not appear to have any significant symptoms associated with neutropenia at this time.  Of interest, patient does have complaint of heaviness in bilateral eyelids, although without apparent fatigability.  This is concerning for possible early development of myasthenia gravis and needs to be further evaluated by appropriate specialist.  Plan:  -Consult neurology for evaluation for possible myasthenia gravis -Monthly labs to continue monitoring leukocyte count trajectory. - Return to clinic in 3 months for continued hematological monitoring.

## 2017-07-29 ENCOUNTER — Ambulatory Visit (INDEPENDENT_AMBULATORY_CARE_PROVIDER_SITE_OTHER): Payer: 59 | Admitting: Diagnostic Neuroimaging

## 2017-07-29 ENCOUNTER — Encounter: Payer: Self-pay | Admitting: Diagnostic Neuroimaging

## 2017-07-29 VITALS — BP 107/71 | HR 55 | Ht 64.0 in | Wt 166.4 lb

## 2017-07-29 DIAGNOSIS — R200 Anesthesia of skin: Secondary | ICD-10-CM | POA: Diagnosis not present

## 2017-07-29 DIAGNOSIS — R202 Paresthesia of skin: Secondary | ICD-10-CM | POA: Diagnosis not present

## 2017-07-29 DIAGNOSIS — H538 Other visual disturbances: Secondary | ICD-10-CM | POA: Diagnosis not present

## 2017-07-29 DIAGNOSIS — H02403 Unspecified ptosis of bilateral eyelids: Secondary | ICD-10-CM

## 2017-07-29 NOTE — Patient Instructions (Signed)
-   check labs  - if normal, then may check MRI

## 2017-07-29 NOTE — Progress Notes (Signed)
[facility]  PATIENT: Michelle [name] DOB: 1981/05/13  REFERRING CLINICIAN: Lebron Conners, M HISTORY FROM: patient  REASON FOR VISIT: new consult    HISTORICAL  CHIEF COMPLAINT:  Chief Complaint  Patient presents with  . Neutropenia    rm 7, New Pt, "problem with low WBC; heavy eyelids, blurred vision since Nov 2018; hx shingles of left side of face, left eye Feb 2018"    HISTORY OF PRESENT ILLNESS:   37 year old right-handed female here for evaluation of heavy eyelids and blurred vision.  Patient has history of idiopathic neutropenia/leukopenia.  Since October 2018 patient has noticed intermittent blurred vision, sometimes double vision, especially when she is tired or fatigued.  Sometimes she feels heaviness in her eyelids.  Patient had history of shingles affecting left and left face in February 2018 with full resolution of symptoms.  Patient denies any numbness, weakness, in arms or legs.  No speech or swallowing problems.  No shortness of breath.  Patient referred here for evaluate of possible myasthenia gravis.  REVIEW OF SYSTEMS: Full 14 system review of systems performed and negative with exception of: Blurred vision eye pain fatigue headache numbness allergies.  ALLERGIES: Allergies  Allergen Reactions  . Codeine Nausea And Vomiting    HOME MEDICATIONS: Outpatient Medications Prior to Visit  Medication Sig Dispense Refill  . EPINEPHrine 0.3 mg/0.3 mL IJ SOAJ injection INJECT AS DIRECTED AS NEEDED FOR SYSTEMIC ALLERGIC REACTION  1  . PROAIR HFA 108 (90 Base) MCG/ACT inhaler as needed.    Marland Kitchen UNABLE TO FIND Med Name: allergy injections weekly     No facility-administered medications prior to visit.     PAST MEDICAL HISTORY: Past Medical History:  Diagnosis Date  . History of shingles 07/2016   left side of face, left eye  . Migraines   . Neutropenia ([place])     PAST SURGICAL HISTORY: Past Surgical History:  Procedure Laterality Date  .  BREAST LUMPECTOMY Left 1994   benign    FAMILY HISTORY: Family History  Problem Relation Age of Onset  . Asthma Brother   . Heart disease Maternal Grandmother   . Heart disease Maternal Grandfather     SOCIAL HISTORY:  Social History   Socioeconomic History  . Marital status: Married    Spouse name: Iona Beard  . Number of children: 1  . Years of education: 43  . Highest education level: Not on file  Social Needs  . Financial resource strain: Not on file  . Food insecurity - worry: Not on file  . Food insecurity - inability: Not on file  . Transportation needs - medical: Not on file  . Transportation needs - non-medical: Not on file  Occupational History    Comment: Center for [employer]  Tobacco Use  . Smoking status: Never Smoker  . Smokeless tobacco: Never Used  Substance and Sexual Activity  . Alcohol use: Yes    Alcohol/week: 0.0 oz    Frequency: Never    Comment: 07/29/17  1-2 monthly  . Drug use: No  . Sexual activity: Yes    Birth control/protection: IUD  Other Topics Concern  . Not on file  Social History Narrative   Lives with husband, son   Caffeine- maybe 1 daily     PHYSICAL EXAM  GENERAL EXAM/CONSTITUTIONAL: Vitals:  Vitals:   07/29/17 0814  BP: 107/71  Pulse: (!) 55  Weight: 166 lb 6.4 oz (75.5 kg)  Height: 5' 4"  (1.626 m)     Body mass  index is 28.56 kg/m.  Visual Acuity Screening   Right eye Left eye Both eyes  Without correction:     With correction: 20/20 20/30   Comments: contacts    Patient is in no distress; well developed, nourished and groomed; neck is supple  CARDIOVASCULAR:  Examination of carotid arteries is normal; no carotid bruits  Regular rate and rhythm, no murmurs  Examination of peripheral vascular system by observation and palpation is normal  EYES:  Ophthalmoscopic exam of optic discs and posterior segments is normal; no papilledema or hemorrhages  MUSCULOSKELETAL:  Gait, strength, tone,  movements noted in Neurologic exam below  NEUROLOGIC: MENTAL STATUS:  No flowsheet data found.  awake, alert, oriented to person, place and time  recent and remote memory intact  normal attention and concentration  language fluent, comprehension intact, naming intact,   fund of knowledge appropriate  CRANIAL NERVE:   2nd - no papilledema on fundoscopic exam  2nd, 3rd, 4th, 6th - pupils equal and reactive to light, visual fields full to confrontation, extraocular muscles intact, no nystagmus  5th - facial sensation symmetric  7th - facial strength symmetric  8th - hearing intact  9th - palate elevates symmetrically, uvula midline  11th - shoulder shrug symmetric  12th - tongue protrusion midline  SUBJECTIVE BLURRED VISION ON EY MOVEMENT TESTING  MILD INCREASE IN BLURRED VISION AFTER 1 MINUTE UPGAZE  MILD BILATERAL PTOSIS AT END OF EXAM  MOTOR:   normal bulk and tone, full strength in the BUE, BLE  ABLE TO SIT / STAND X 10; NO INCREASE IN WEAKNESS AFTER EXERCISE  ABLE TO COUNT TO 15 IN 1 BREATH  SENSORY:   normal and symmetric to light touch, temperature, vibration  COORDINATION:   finger-nose-finger, fine finger movements normal  REFLEXES:   deep tendon reflexes present and symmetric  GAIT/STATION:   narrow based gait; romberg is negative    DIAGNOSTIC DATA (LABS, IMAGING, TESTING) - I reviewed patient records, labs, notes, testing and imaging myself where available.  Lab Results  Component Value Date   WBC 2.9 (L) 07/28/2017   HGB 12.8 07/28/2017   HCT 38.7 07/28/2017   MCV 87.6 07/28/2017   PLT 208 07/28/2017      Component Value Date/Time   NA 140 04/19/2017 1420   K 3.3 (L) 04/19/2017 1420   CL 109 01/08/2010 0900   CO2 28 04/19/2017 1420   GLUCOSE 106 04/19/2017 1420   BUN 10.9 04/19/2017 1420   CREATININE 0.9 04/19/2017 1420   CALCIUM 9.3 04/19/2017 1420   PROT 7.6 04/19/2017 1420   ALBUMIN 4.1 04/19/2017 1420   AST 19  04/19/2017 1420   ALT 11 04/19/2017 1420   ALKPHOS 56 04/19/2017 1420   BILITOT 0.38 04/19/2017 1420   GFRNONAA >60 01/08/2010 0900   GFRAA  01/08/2010 0900    >60        The eGFR has been calculated using the MDRD equation. This calculation has not been validated in all clinical situations. eGFR's persistently <60 mL/min signify possible Chronic Kidney Disease.   No results found for: CHOL, HDL, LDLCALC, LDLDIRECT, TRIG, CHOLHDL No results found for: HGBA1C No results found for: VITAMINB12 No results found for: TSH   01/08/10 CT head [I reviewed images myself and agree with interpretation. -VRP]  - Normal    ASSESSMENT AND PLAN  37 y.o. year old female here with new onset blurred vision, double vision, heavy eyelids, fluctuating symptoms since October 2018.  We will proceed with  further workup for neuromuscular disease (myasthenia gravis), myopathy, or other potential CNS causes.  Dx:  1. Ptosis of both eyelids   2. Blurred vision   3. Numbness and tingling in both hands      PLAN: - check labs - if normal, then may check MRI brain and orbits  Orders Placed This Encounter  Procedures  . Acetylcholine Receptor, Binding  . CK  . Aldolase  . TSH  . Vitamin B12  . Hemoglobin A1c   Return in about 6 months (around 01/26/2018).    Penni Bombard, MD 1/36/8599, 2:34 AM Certified in Neurology, Neurophysiology and Neuroimaging  [facility] [address], [place], [place] [address] [phone]

## 2017-08-01 ENCOUNTER — Other Ambulatory Visit: Payer: Self-pay | Admitting: Diagnostic Neuroimaging

## 2017-08-01 DIAGNOSIS — H532 Diplopia: Secondary | ICD-10-CM

## 2017-08-01 DIAGNOSIS — H02403 Unspecified ptosis of bilateral eyelids: Secondary | ICD-10-CM

## 2017-08-01 LAB — CK: Total CK: 123 U/L (ref 24–173)

## 2017-08-01 LAB — HEMOGLOBIN A1C
Est. average glucose Bld gHb Est-mCnc: 103 mg/dL
Hgb A1c MFr Bld: 5.2 % (ref 4.8–5.6)

## 2017-08-01 LAB — VITAMIN B12: VITAMIN B 12: 482 pg/mL (ref 232–1245)

## 2017-08-01 LAB — ACETYLCHOLINE RECEPTOR, BINDING: AChR Binding Ab, Serum: <0.03 nmol/L (ref 0.00–0.24)

## 2017-08-01 LAB — ALDOLASE: Aldolase: 4.8 U/L (ref 3.3–10.3)

## 2017-08-01 LAB — TSH: TSH: 1.26 u[IU]/mL (ref 0.450–4.500)

## 2017-08-01 NOTE — Progress Notes (Signed)
Labs normal. Will proceed with MRI brain and orbits. -VRP

## 2017-08-02 ENCOUNTER — Telehealth: Payer: Self-pay | Admitting: *Deleted

## 2017-08-02 NOTE — Telephone Encounter (Signed)
LVM informing patient she has normal labs.   Advised her Dr Marjory LiesPenumalli has ordered MRI brain and orbits next. She will get a call to schedule after insurance approves. Advised she'll get a call with results when available. Left number for any questions.

## 2017-08-25 ENCOUNTER — Inpatient Hospital Stay: Payer: 59 | Attending: Hematology and Oncology

## 2017-08-25 DIAGNOSIS — D709 Neutropenia, unspecified: Secondary | ICD-10-CM | POA: Diagnosis not present

## 2017-08-25 LAB — CBC WITH DIFFERENTIAL (CANCER CENTER ONLY)
Basophils Absolute: 0.1 10*3/uL (ref 0.0–0.1)
Basophils Relative: 2 %
EOS ABS: 0 10*3/uL (ref 0.0–0.5)
Eosinophils Relative: 0 %
HCT: 39.7 % (ref 34.8–46.6)
Hemoglobin: 13.4 g/dL (ref 11.6–15.9)
LYMPHS ABS: 1.5 10*3/uL (ref 0.9–3.3)
Lymphocytes Relative: 49 %
MCH: 29 pg (ref 25.1–34.0)
MCHC: 33.7 g/dL (ref 31.5–36.0)
MCV: 86 fL (ref 79.5–101.0)
Monocytes Absolute: 0.2 10*3/uL (ref 0.1–0.9)
Monocytes Relative: 8 %
NEUTROS PCT: 41 %
Neutro Abs: 1.3 10*3/uL — ABNORMAL LOW (ref 1.5–6.5)
Platelet Count: 201 10*3/uL (ref 145–400)
RBC: 4.61 MIL/uL (ref 3.70–5.45)
RDW: 12.8 % (ref 11.2–14.5)
WBC Count: 3.1 10*3/uL — ABNORMAL LOW (ref 3.9–10.3)

## 2017-09-28 ENCOUNTER — Inpatient Hospital Stay: Payer: 59 | Attending: Hematology and Oncology

## 2017-09-28 DIAGNOSIS — D709 Neutropenia, unspecified: Secondary | ICD-10-CM | POA: Insufficient documentation

## 2017-09-28 LAB — CBC WITH DIFFERENTIAL (CANCER CENTER ONLY)
BASOS PCT: 0 %
Basophils Absolute: 0 10*3/uL (ref 0.0–0.1)
EOS ABS: 0 10*3/uL (ref 0.0–0.5)
Eosinophils Relative: 0 %
HCT: 39.8 % (ref 34.8–46.6)
Hemoglobin: 13.6 g/dL (ref 11.6–15.9)
Lymphocytes Relative: 51 %
Lymphs Abs: 1.2 10*3/uL (ref 0.9–3.3)
MCH: 29.4 pg (ref 25.1–34.0)
MCHC: 34.2 g/dL (ref 31.5–36.0)
MCV: 86.1 fL (ref 79.5–101.0)
MONO ABS: 0.3 10*3/uL (ref 0.1–0.9)
MONOS PCT: 11 %
Neutro Abs: 0.9 10*3/uL — ABNORMAL LOW (ref 1.5–6.5)
Neutrophils Relative %: 38 %
Platelet Count: 207 10*3/uL (ref 145–400)
RBC: 4.62 MIL/uL (ref 3.70–5.45)
RDW: 12.5 % (ref 11.2–14.5)
WBC Count: 2.4 10*3/uL — ABNORMAL LOW (ref 3.9–10.3)

## 2017-09-29 ENCOUNTER — Other Ambulatory Visit: Payer: 59

## 2017-10-27 ENCOUNTER — Inpatient Hospital Stay: Payer: 59

## 2017-10-27 ENCOUNTER — Telehealth: Payer: Self-pay

## 2017-10-27 ENCOUNTER — Inpatient Hospital Stay: Payer: 59 | Attending: Hematology and Oncology | Admitting: Hematology and Oncology

## 2017-10-27 VITALS — BP 106/71 | HR 57 | Temp 98.9°F | Resp 18 | Ht 64.0 in | Wt 164.2 lb

## 2017-10-27 DIAGNOSIS — D709 Neutropenia, unspecified: Secondary | ICD-10-CM

## 2017-10-27 DIAGNOSIS — D72819 Decreased white blood cell count, unspecified: Secondary | ICD-10-CM | POA: Insufficient documentation

## 2017-10-27 LAB — CBC WITH DIFFERENTIAL (CANCER CENTER ONLY)
Basophils Absolute: 0 10*3/uL (ref 0.0–0.1)
Basophils Relative: 0 %
Eosinophils Absolute: 0 10*3/uL (ref 0.0–0.5)
Eosinophils Relative: 0 %
HEMATOCRIT: 40.6 % (ref 34.8–46.6)
HEMOGLOBIN: 13.6 g/dL (ref 11.6–15.9)
LYMPHS ABS: 1.3 10*3/uL (ref 0.9–3.3)
LYMPHS PCT: 43 %
MCH: 29.2 pg (ref 25.1–34.0)
MCHC: 33.5 g/dL (ref 31.5–36.0)
MCV: 87.3 fL (ref 79.5–101.0)
MONO ABS: 0.3 10*3/uL (ref 0.1–0.9)
MONOS PCT: 12 %
NEUTROS ABS: 1.3 10*3/uL — AB (ref 1.5–6.5)
Neutrophils Relative %: 45 %
Platelet Count: 239 10*3/uL (ref 145–400)
RBC: 4.65 MIL/uL (ref 3.70–5.45)
RDW: 12.6 % (ref 11.2–14.5)
WBC Count: 2.9 10*3/uL — ABNORMAL LOW (ref 3.9–10.3)

## 2017-10-27 LAB — CMP (CANCER CENTER ONLY)
ALBUMIN: 4.3 g/dL (ref 3.5–5.0)
ALK PHOS: 59 U/L (ref 40–150)
ALT: 13 U/L (ref 0–55)
AST: 18 U/L (ref 5–34)
Anion gap: 7 (ref 3–11)
BUN: 10 mg/dL (ref 7–26)
CHLORIDE: 109 mmol/L (ref 98–109)
CO2: 24 mmol/L (ref 22–29)
Calcium: 9.4 mg/dL (ref 8.4–10.4)
Creatinine: 0.9 mg/dL (ref 0.60–1.10)
GFR, Est AFR Am: >60 mL/min (ref >60)
GFR, Estimated: >60 mL/min (ref >60)
GLUCOSE: 83 mg/dL (ref 70–140)
POTASSIUM: 4.8 mmol/L (ref 3.5–5.1)
SODIUM: 140 mmol/L (ref 136–145)
Total Bilirubin: 0.4 mg/dL (ref 0.2–1.2)
Total Protein: 7.8 g/dL (ref 6.4–8.3)

## 2017-10-27 LAB — LACTATE DEHYDROGENASE: LDH: 139 U/L (ref 125–245)

## 2017-10-27 NOTE — Telephone Encounter (Signed)
Printed avs and calender of upcoming appointment. Per 5/16 los 

## 2017-11-11 ENCOUNTER — Encounter: Payer: Self-pay | Admitting: Hematology and Oncology

## 2017-11-11 NOTE — Assessment & Plan Note (Signed)
37 y.o. 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  The exact nature of the decline and which branch of the blood counts is diminished is unclear at this time based on the available labs.  There has been interval decline in the neutrophil count with mild neutropenia observed on evaluation today.  No changes in the hemoglobin or platelet counts. 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  Patient does not appear to have any significant symptoms associated with neutropenia at this time.  Continued monitoring reveals intermittent oscillating pattern of neutropenia ranging between mild to moderate deficiency.  No significant changes in hemoglobin or platelets.  Plan:  -Continue observation - Return to clinic in 6 months for continued hematological monitoring.

## 2017-11-11 NOTE — Progress Notes (Signed)
[facility] Follow-up Visit:  Assessment: Neutropenia [facility]) 37 y.o. 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  The exact nature of the decline and which branch of the blood counts is diminished is unclear at this time based on the available labs.  There has been interval decline in the neutrophil count with mild neutropenia observed on evaluation today.  No changes in the hemoglobin or platelet counts. 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  Patient does not appear to have any significant symptoms associated with neutropenia at this time.  Continued monitoring reveals intermittent oscillating pattern of neutropenia ranging between mild to moderate deficiency.  No significant changes in hemoglobin or platelets.  Plan:  -Continue observation - Return to clinic in 6 months for continued hematological monitoring.  Voice recognition software was used and creation of this note. Despite my best effort at editing the text, some misspelling/errors may have occurred.  Orders Placed This Encounter  Procedures  . CBC with Differential (Cancer Center Only)    Standing Status:   Future    Standing Expiration Date:   10/28/2018  . CMP ([place] only)    Standing Status:   Future    Standing Expiration Date:   10/28/2018  . Lactate dehydrogenase (LDH)    Standing Status:   Future    Standing Expiration Date:   10/27/2018   All questions were answered.  . The patient knows to call the clinic with any problems, questions or concerns.  This note was electronically signed.    History of Presenting Illness Michelle [name] is a 37 y.o. female followed in the [place] for evaluation of leukopenia, referred by Dr. Deland Pretty.  Past medical history is significant for eczema and migraines.   Patient continues  observation in our clinic.  Since last visit to the clinic, patient had a single upper respiratory tract viral infection for which she has recovered completely.  In the interim, has been evaluated by neurology for possible diagnosis of myasthenia gravis.  Evaluation is presently ongoing.  Patient denies any new symptoms since last visit to the clinic.   Oncological/hematological History: --Labs, 01/08/10: WBC 3.0, ANC 1.5, ALC 1.3, Mono 0.3, Eos 0, Baso 0,  Hgb 14.0,                                                         Plt 204;  --Labs, 04/02/16: WBC 3.5,                                                                          Hgb 13.3, MCV 88.4, MCH 29.2, RDW 12.2,  Plt 232; --Labs, 04/06/17: WBC 2.5,  Hgb 13.2, MCV 86.0, MCH 28.9, RDW 12.3,  Plt 232; --Labs, 04/19/17: WBC 4.0, ANC 2.0, ALC 1.7, Mono 0.2, Eos 0, Baso 0,  Hgb 12.7,                                                         Plt 192; LDH 151; ANA, RF, anti-dsDNA Ab, & ANCA negative. --Labs, 07/28/17: WBC 2.9, ANC 1.2, ALC 1.4, Mono 0.3,              Hgb 12.8,               Plt 208; --Labs, 08/25/17: WBC 3.1, ANC 1.3, ALC 1.5, Mono 0.2,   Hgb 13.4,      Plt 201 --Labs, 09/28/17: WBC 2.4, ANC 0.9, ALC 1.2, Mono 0.3,   Hgb 13.6,     Plt 207 --Labs, 10/27/17: WBC 2.9, ANC 1.3, ALC 1.3, Mono 0.3,   Hgb 13.6,     Plt 239    Medical History: Past Medical History:  Diagnosis Date  . History of shingles 07/2016   left side of face, left eye  . Migraines   . Neutropenia [facility])     Surgical History: Past Surgical History:  Procedure Laterality Date  . BREAST LUMPECTOMY Left 1994   benign    Family History: Family History  Problem Relation Age of Onset  . Asthma Brother   . Heart disease Maternal Grandmother   . Heart disease Maternal Grandfather     Social History: Social History   Socioeconomic History  . Marital status: Married    Spouse name:  Iona Beard  . Number of children: 1  . Years of education: 74  . Highest education level: Not on file  Occupational History    Comment: [employer]  Social Needs  . Financial resource strain: Not on file  . Food insecurity:    Worry: Not on file    Inability: Not on file  . Transportation needs:    Medical: Not on file    Non-medical: Not on file  Tobacco Use  . Smoking status: Never Smoker  . Smokeless tobacco: Never Used  Substance and Sexual Activity  . Alcohol use: Yes    Alcohol/week: 0.0 oz    Frequency: Never    Comment: 07/29/17  1-2 monthly  . Drug use: No  . Sexual activity: Yes    Birth control/protection: IUD  Lifestyle  . Physical activity:    Days per week: Not on file    Minutes per session: Not on file  . Stress: Not on file  Relationships  . Social connections:    Talks on phone: Not on file    Gets together: Not on file    Attends religious service: Not on file    Active member of club or organization: Not on file    Attends meetings of clubs or organizations: Not on file    Relationship status: Not on file  . Intimate partner violence:    Fear of current or ex partner: Not on file    Emotionally abused: Not on file    Physically abused: Not on file    Forced sexual activity: Not on file  Other Topics Concern  . Not on file  Social History Narrative   Lives with husband, son  Caffeine- maybe 1 daily    Allergies: Allergies  Allergen Reactions  . Codeine Nausea And Vomiting    Medications:  Current Outpatient Medications  Medication Sig Dispense Refill  . EPINEPHrine 0.3 mg/0.3 mL IJ SOAJ injection INJECT AS DIRECTED AS NEEDED FOR SYSTEMIC ALLERGIC REACTION  1  . PROAIR HFA 108 (90 Base) MCG/ACT inhaler as needed.    Marland Kitchen UNABLE TO FIND Med Name: allergy injections weekly     No current facility-administered medications for this visit.     Review of Systems: Review of Systems  Constitutional: Positive for fatigue.   Eyes: Positive for eye problems.  All other systems reviewed and are negative.    PHYSICAL EXAMINATION Blood pressure 106/71, pulse (!) 57, temperature 98.9 F (37.2 C), temperature source Oral, resp. rate 18, height _0  (1.626 m), weight 164 lb 3.2 oz (74.5 kg), SpO2 100 %.  ECOG PERFORMANCE STATUS: 1 - Symptomatic but completely ambulatory  Physical Exam  Constitutional: She is oriented to person, place, and time and well-developed, well-nourished, and in no distress. No distress.  HENT:  Head: Normocephalic and atraumatic.  Mouth/Throat: Oropharynx is clear and moist. No oropharyngeal exudate.  Eyes: Pupils are equal, round, and reactive to light. Conjunctivae and EOM are normal. No scleral icterus.  Neck: No thyromegaly present.  Cardiovascular: Normal rate and regular rhythm.  No murmur heard. Pulmonary/Chest: Effort normal and breath sounds normal. No respiratory distress. She has no wheezes. She has no rales.  Abdominal: Soft. Bowel sounds are normal. She exhibits no distension. There is no tenderness.  Musculoskeletal: She exhibits no edema.  Lymphadenopathy:    She has no cervical adenopathy.  Neurological: She is alert and oriented to person, place, and time. She has normal reflexes. No cranial nerve deficit.  Skin: Skin is warm and dry. No rash noted. She is not diaphoretic.     LABORATORY DATA: I have personally reviewed the data as listed: Appointment on 10/27/2017  Component Date Value Ref Range Status  . LDH 10/27/2017 139  125 - 245 U/L Final   Performed at [facility], [place] [address] [place], [place] [address]  . Sodium 10/27/2017 140  136 - 145 mmol/L Final  . Potassium 10/27/2017 4.8  3.5 - 5.1 mmol/L Final  . Chloride 10/27/2017 109  98 - 109 mmol/L Final  . CO2 10/27/2017 24  22 - 29 mmol/L Final  . Glucose, Bld 10/27/2017 83  70 - 140 mg/dL Final  . BUN 10/27/2017 10  7 - 26 mg/dL Final  . Creatinine 10/27/2017 0.90  0.60 -  1.10 mg/dL Final  . Calcium 10/27/2017 9.4  8.4 - 10.4 mg/dL Final  . Total Protein 10/27/2017 7.8  6.4 - 8.3 g/dL Final  . Albumin 10/27/2017 4.3  3.5 - 5.0 g/dL Final  . AST 10/27/2017 18  5 - 34 U/L Final  . ALT 10/27/2017 13  0 - 55 U/L Final  . Alkaline Phosphatase 10/27/2017 59  40 - 150 U/L Final  . Total Bilirubin 10/27/2017 0.4  0.2 - 1.2 mg/dL Final  . GFR, Est Non Af Am 10/27/2017 >60  >60 mL/min Final  . GFR, Est AFR Am 10/27/2017 >60  >60 mL/min Final   Comment: (NOTE) The eGFR has been calculated using the CKD EPI equation. This calculation has not been validated in all clinical situations. eGFR's persistently <60 mL/min signify possible Chronic Kidney Disease.   . Anion gap 10/27/2017 7  3 - 11 Final  Performed at [facility], [place] [address] [place], [place] [address]  . WBC Count 10/27/2017 2.9* 3.9 - 10.3 K/uL Final  . RBC 10/27/2017 4.65  3.70 - 5.45 MIL/uL Final  . Hemoglobin 10/27/2017 13.6  11.6 - 15.9 g/dL Final  . HCT 10/27/2017 40.6  34.8 - 46.6 % Final  . MCV 10/27/2017 87.3  79.5 - 101.0 fL Final  . MCH 10/27/2017 29.2  25.1 - 34.0 pg Final  . MCHC 10/27/2017 33.5  31.5 - 36.0 g/dL Final  . RDW 10/27/2017 12.6  11.2 - 14.5 % Final  . Platelet Count 10/27/2017 239  145 - 400 K/uL Final  . Neutrophils Relative % 10/27/2017 45  % Final  . Neutro Abs 10/27/2017 1.3* 1.5 - 6.5 K/uL Final  . Lymphocytes Relative 10/27/2017 43  % Final  . Lymphs Abs 10/27/2017 1.3  0.9 - 3.3 K/uL Final  . Monocytes Relative 10/27/2017 12  % Final  . Monocytes Absolute 10/27/2017 0.3  0.1 - 0.9 K/uL Final  . Eosinophils Relative 10/27/2017 0  % Final  . Eosinophils Absolute 10/27/2017 0.0  0.0 - 0.5 K/uL Final  . Basophils Relative 10/27/2017 0  % Final  . Basophils Absolute 10/27/2017 0.0  0.0 - 0.1 K/uL Final   Performed at [facility], [place] [address] [place], [place] [address]       Ardath Sax, MD

## 2018-01-31 ENCOUNTER — Ambulatory Visit: Payer: 59 | Admitting: Diagnostic Neuroimaging

## 2018-04-20 ENCOUNTER — Telehealth: Payer: Self-pay | Admitting: Hematology

## 2018-04-20 NOTE — Telephone Encounter (Signed)
Out 11/14 - per [facility] move lab/fu four weeks out. Left message re lab/fu 12/12. Schedule mailed.

## 2018-04-27 ENCOUNTER — Other Ambulatory Visit: Payer: 59

## 2018-04-27 ENCOUNTER — Ambulatory Visit: Payer: 59 | Admitting: Hematology

## 2018-05-24 ENCOUNTER — Inpatient Hospital Stay: Payer: 59 | Attending: Hematology

## 2018-05-24 ENCOUNTER — Inpatient Hospital Stay (HOSPITAL_BASED_OUTPATIENT_CLINIC_OR_DEPARTMENT_OTHER): Payer: 59 | Admitting: Hematology

## 2018-05-24 VITALS — BP 118/71 | HR 54 | Temp 98.7°F | Resp 18 | Ht 64.0 in | Wt 173.1 lb

## 2018-05-24 DIAGNOSIS — Z79899 Other long term (current) drug therapy: Secondary | ICD-10-CM | POA: Insufficient documentation

## 2018-05-24 DIAGNOSIS — D709 Neutropenia, unspecified: Secondary | ICD-10-CM

## 2018-05-24 LAB — CMP (CANCER CENTER ONLY)
ALT: 10 U/L (ref 0–44)
AST: 18 U/L (ref 15–41)
Albumin: 4.3 g/dL (ref 3.5–5.0)
Alkaline Phosphatase: 55 U/L (ref 38–126)
Anion gap: 8 (ref 5–15)
BILIRUBIN TOTAL: 0.4 mg/dL (ref 0.3–1.2)
BUN: 13 mg/dL (ref 6–20)
CHLORIDE: 106 mmol/L (ref 98–111)
CO2: 26 mmol/L (ref 22–32)
Calcium: 9.6 mg/dL (ref 8.9–10.3)
Creatinine: 0.83 mg/dL (ref 0.44–1.00)
GLUCOSE: 95 mg/dL (ref 70–99)
Potassium: 3.9 mmol/L (ref 3.5–5.1)
Sodium: 140 mmol/L (ref 135–145)
Total Protein: 7.8 g/dL (ref 6.5–8.1)

## 2018-05-24 LAB — CBC WITH DIFFERENTIAL (CANCER CENTER ONLY)
Abs Immature Granulocytes: 0 10*3/uL (ref 0.00–0.07)
BASOS PCT: 0 %
Basophils Absolute: 0 10*3/uL (ref 0.0–0.1)
EOS PCT: 0 %
Eosinophils Absolute: 0 10*3/uL (ref 0.0–0.5)
HEMATOCRIT: 40.2 % (ref 36.0–46.0)
HEMOGLOBIN: 13.1 g/dL (ref 12.0–15.0)
IMMATURE GRANULOCYTES: 0 %
LYMPHS ABS: 1.3 10*3/uL (ref 0.7–4.0)
LYMPHS PCT: 36 %
MCH: 28.6 pg (ref 26.0–34.0)
MCHC: 32.6 g/dL (ref 30.0–36.0)
MCV: 87.8 fL (ref 80.0–100.0)
Monocytes Absolute: 0.3 10*3/uL (ref 0.1–1.0)
Monocytes Relative: 8 %
NRBC: 0 % (ref 0.0–0.2)
Neutro Abs: 2 10*3/uL (ref 1.7–7.7)
Neutrophils Relative %: 56 %
PLATELETS: 223 10*3/uL (ref 150–400)
RBC: 4.58 MIL/uL (ref 3.87–5.11)
RDW: 12.1 % (ref 11.5–15.5)
WBC Count: 3.5 10*3/uL — ABNORMAL LOW (ref 4.0–10.5)

## 2018-05-24 LAB — LACTATE DEHYDROGENASE: LDH: 254 U/L — ABNORMAL HIGH (ref 98–192)

## 2018-05-24 NOTE — Progress Notes (Signed)
HEMATOLOGY/ONCOLOGY CONSULTATION NOTE  Date of Service: 05/24/2018  Patient Care Team: Thayer Headings, MD as PCP - General (Internal Medicine)  CHIEF COMPLAINTS/PURPOSE OF CONSULTATION:  Neutropenia   HISTORY OF PRESENTING ILLNESS:  Michelle [name] is a wonderful 37 y.o. female who has been previously seen by my colleague Dr. Milinda Antis for evaluation and management of Neutropenia. The patient last saw Dr. Gweneth Dimitri on 10/27/17. The pt reports that she is doing well overall.   The pt reports that she has not had any concerns of recurrent infections or frequent infections. The pt denies any fevers, chills, night sweats, unexpected weight loss, or new fatigue.  The pt notes that she had shingles a year ago, and that this affected her right eyelid, for which she is now using eye drops. She is also now taking Vitamin D replacement but denies any other medication changes.   She denies any recent trauma. The pt had an allergy shot in her upper left arm a couple days ago. She received her annual flu vaccine in September.   Most recent lab results (05/24/18) of CBC w/diff and CMP is as follows: all values are WNL except for WBC at 3.5k. 05/24/18 LDH at 254  On review of systems, pt reports stable energy levels, and denies fevers, chills, night sweats, unexpected weight loss, mouth sores, concerns for infections, abdominal pains, noticing any new lumps or bumps, leg swelling, and any other symptoms.   MEDICAL HISTORY:  Past Medical History:  Diagnosis Date  . History of shingles 07/2016   left side of face, left eye  . Migraines   . Neutropenia (HCC)     SURGICAL HISTORY: Past Surgical History:  Procedure Laterality Date  . BREAST LUMPECTOMY Left 1994   benign    SOCIAL HISTORY: Social History   Socioeconomic History  . Marital status: Married    Spouse name: Greggory Stallion  . Number of children: 1  . Years of education: 49  . Highest education level: Not on file  Occupational  History    Comment: [employer]  Social Needs  . Financial resource strain: Not on file  . Food insecurity:    Worry: Not on file    Inability: Not on file  . Transportation needs:    Medical: Not on file    Non-medical: Not on file  Tobacco Use  . Smoking status: Never Smoker  . Smokeless tobacco: Never Used  Substance and Sexual Activity  . Alcohol use: Yes    Alcohol/week: 0.0 standard drinks    Frequency: Never    Comment: 07/29/17  1-2 monthly  . Drug use: No  . Sexual activity: Yes    Birth control/protection: IUD  Lifestyle  . Physical activity:    Days per week: Not on file    Minutes per session: Not on file  . Stress: Not on file  Relationships  . Social connections:    Talks on phone: Not on file    Gets together: Not on file    Attends religious service: Not on file    Active member of club or organization: Not on file    Attends meetings of clubs or organizations: Not on file    Relationship status: Not on file  . Intimate partner violence:    Fear of current or ex partner: Not on file    Emotionally abused: Not on file    Physically abused: Not on file    Forced sexual activity: Not on file  Other Topics Concern  . Not on file  Social History Narrative   Lives with husband, son   Caffeine- maybe 1 daily    FAMILY HISTORY: Family History  Problem Relation Age of Onset  . Asthma Brother   . Heart disease Maternal Grandmother   . Heart disease Maternal Grandfather     ALLERGIES:  is allergic to codeine.  MEDICATIONS:  Current Outpatient Medications  Medication Sig Dispense Refill  . EPINEPHrine 0.3 mg/0.3 mL IJ SOAJ injection INJECT AS DIRECTED AS NEEDED FOR SYSTEMIC ALLERGIC REACTION  1  . PROAIR HFA 108 (90 Base) MCG/ACT inhaler as needed.    Marland Kitchen UNABLE TO FIND Med Name: allergy injections weekly     No current facility-administered medications for this visit.     REVIEW OF SYSTEMS:    10 Point review of Systems was done  is negative except as noted above.  PHYSICAL EXAMINATION:  . Vitals:   05/24/18 1454  BP: 118/71  Pulse: (!) 54  Resp: 18  Temp: 98.7 F (37.1 C)  SpO2: 100%   Filed Weights   05/24/18 1454  Weight: 173 lb 1.6 oz (78.5 kg)   .Body mass index is 29.71 kg/m.  GENERAL:alert, in no acute distress and comfortable SKIN: no acute rashes, no significant lesions EYES: conjunctiva are pink and non-injected, sclera anicteric OROPHARYNX: MMM, no exudates, no oropharyngeal erythema or ulceration NECK: supple, no JVD LYMPH:  no palpable lymphadenopathy in the cervical, axillary or inguinal regions LUNGS: clear to auscultation b/l with normal respiratory effort HEART: regular rate & rhythm ABDOMEN:  normoactive bowel sounds , non tender, not distended. Extremity: no pedal edema PSYCH: alert & oriented x 3 with fluent speech NEURO: no focal motor/sensory deficits  LABORATORY DATA:  I have reviewed the data as listed  . CBC Latest Ref Rng & Units 05/24/2018 10/27/2017 09/28/2017  WBC 4.0 - 10.5 K/uL 3.5(L) 2.9(L) 2.4(L)  Hemoglobin 12.0 - 15.0 g/dL 16.1 09.6 04.5  Hematocrit 36.0 - 46.0 % 40.2 40.6 39.8  Platelets 150 - 400 K/uL 223 239 207   . CBC    Component Value Date/Time   WBC 3.5 (L) 05/24/2018 1415   WBC 2.9 (L) 07/28/2017 0828   RBC 4.58 05/24/2018 1415   HGB 13.1 05/24/2018 1415   HGB 12.7 04/19/2017 1420   HCT 40.2 05/24/2018 1415   HCT 37.3 04/19/2017 1420   PLT 223 05/24/2018 1415   PLT 192 04/19/2017 1420   MCV 87.8 05/24/2018 1415   MCV 85.7 04/19/2017 1420   MCH 28.6 05/24/2018 1415   MCHC 32.6 05/24/2018 1415   RDW 12.1 05/24/2018 1415   RDW 12.4 04/19/2017 1420   LYMPHSABS 1.3 05/24/2018 1415   LYMPHSABS 1.7 04/19/2017 1420   MONOABS 0.3 05/24/2018 1415   MONOABS 0.2 04/19/2017 1420   EOSABS 0.0 05/24/2018 1415   EOSABS 0.0 04/19/2017 1420   BASOSABS 0.0 05/24/2018 1415   BASOSABS 0.0 04/19/2017 1420     . CMP Latest Ref Rng & Units 05/24/2018  10/27/2017 04/19/2017  Glucose 70 - 99 mg/dL 95 83 409  BUN 6 - 20 mg/dL 13 10 81.1  Creatinine 0.44 - 1.00 mg/dL 9.14 7.82 0.9  Sodium 956 - 145 mmol/L 140 140 140  Potassium 3.5 - 5.1 mmol/L 3.9 4.8 3.3(L)  Chloride 98 - 111 mmol/L 106 109 -  CO2 22 - 32 mmol/L 26 24 28   Calcium 8.9 - 10.3 mg/dL 9.6 9.4 9.3  Total Protein 6.5 - 8.1 g/dL 7.8  7.8 7.6  Total Bilirubin 0.3 - 1.2 mg/dL 0.4 0.4 0.980.38  Alkaline Phos 38 - 126 U/L 55 59 56  AST 15 - 41 U/L 18 18 19   ALT 0 - 44 U/L 10 13 11      RADIOGRAPHIC STUDIES: I have personally reviewed the radiological images as listed and agreed with the findings in the report. No results found.  ASSESSMENT & PLAN:  37 y.o. female with  1. Neutropenia -Discussed patient's most recent labs from 05/24/18, WBC improved to 3.5k, no anemia, PLT normal at 223k, ANC normalized to 2.0k -Suspect that elevated LDH is reactive to recent IM allergy shots that the pt received a couple days ago -Pt has not had recurrent or frequent infections, she has not had any constitutional symptoms -Previous neutropenia could have been due to a passing viral infection that suppressed her counts, but the fact that her ANC has normalized is reassuring against a concerning progressive pathologic process or primary bone marrow disorder  -Could also be an element of benign ethnic neutropenia -If patient develops progressively worsening counts, would have been an indication for further evaluation -Recommend that the patient continue follow up with her PCP and will be happy to see her back if there are any new concerns     RTC with Dr Candise Che as needed   All of the patients questions were answered with apparent satisfaction. The patient knows to call the clinic with any problems, questions or concerns.  The total time spent in the appt was 20 minutes and more than 50% was on counseling and direct patient cares.    Wyvonnia Lora  MD MS AAHIVMS [facility] Hematology/Oncology  Physician [facility]  (Office):       [phone] (Work cell):  [phone] (Fax):           [phone]  05/24/2018 3:35 PM  I, Marcelline MatesSchuyler Bain, am acting as a scribe for Dr. Wyvonnia Lora .   .I have reviewed the above documentation for accuracy and completeness, and I agree with the above. Johney Maine. Kishore  MD

## 2018-05-25 ENCOUNTER — Telehealth: Payer: Self-pay | Admitting: Hematology

## 2018-05-25 NOTE — Telephone Encounter (Signed)
Per 12/11 return as needed  °

## 2019-05-08 ENCOUNTER — Other Ambulatory Visit: Payer: Self-pay | Admitting: Obstetrics and Gynecology

## 2019-05-08 DIAGNOSIS — N6489 Other specified disorders of breast: Secondary | ICD-10-CM

## 2019-05-18 ENCOUNTER — Ambulatory Visit
Admission: RE | Admit: 2019-05-18 | Discharge: 2019-05-18 | Disposition: A | Discharge status: Home / Self Care | Payer: 59 | Source: Ambulatory Visit | Attending: Obstetrics and Gynecology | Admitting: Obstetrics and Gynecology

## 2019-05-18 ENCOUNTER — Other Ambulatory Visit: Payer: Self-pay

## 2019-05-18 ENCOUNTER — Ambulatory Visit: Payer: 59

## 2019-05-18 DIAGNOSIS — N6489 Other specified disorders of breast: Secondary | ICD-10-CM

## 2021-06-28 IMAGING — MG MM DIGITAL DIAGNOSTIC UNILAT*R* W/ TOMO W/ CAD
4 series · 4 of 12 positions shown · non-contrast
Comparison: Screening mammogram 04/13/2019

CLINICAL DATA: Patient presents for evaluation of possible RIGHT
breast asymmetry following baseline mammogram.

EXAM:
DIGITAL DIAGNOSTIC UNILATERAL RIGHT MAMMOGRAM WITH CAD AND TOMO

[R CC synth-2D]
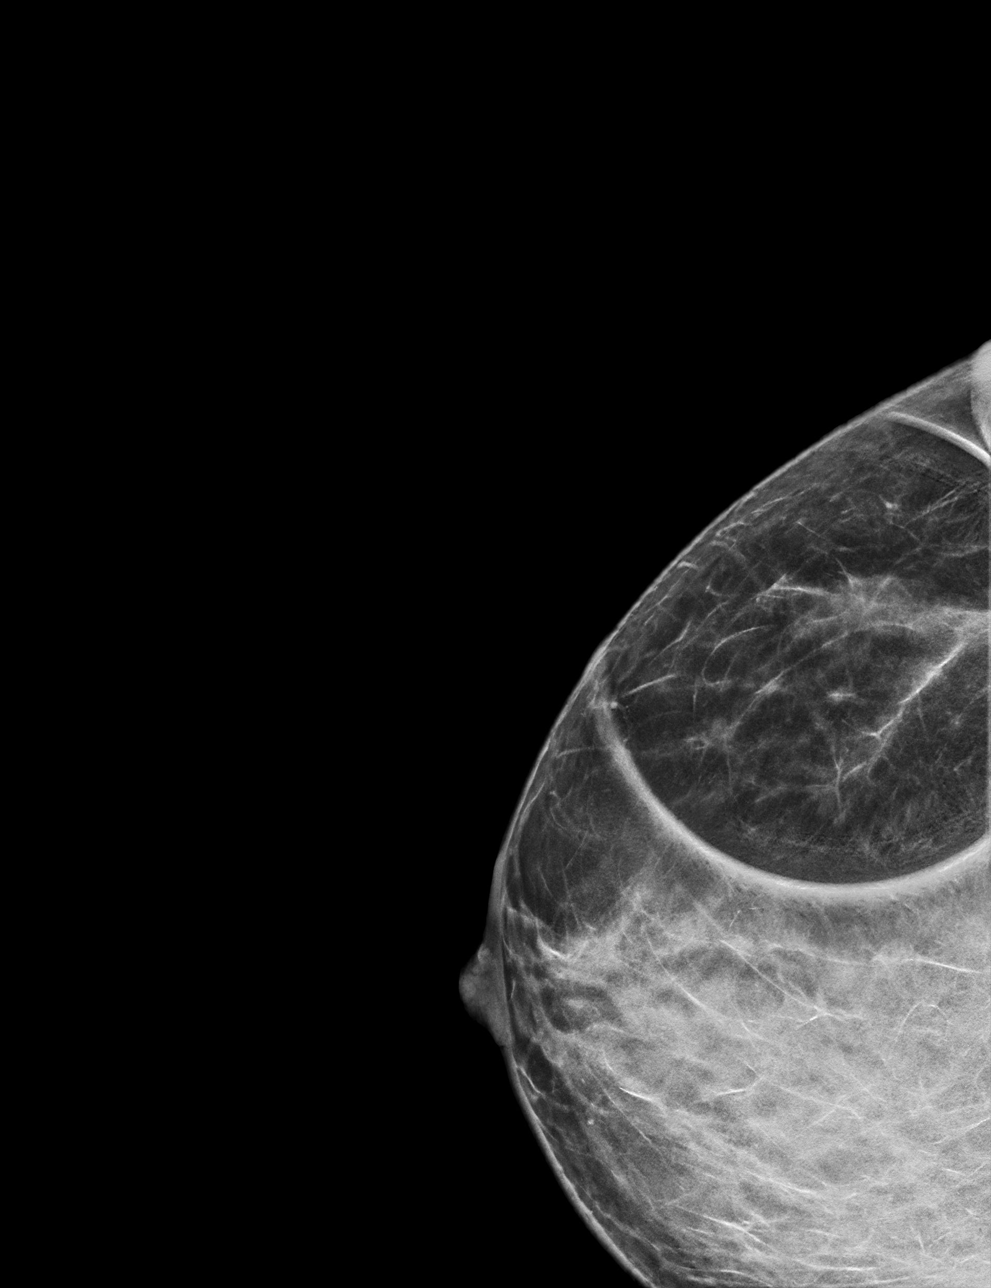

[R MLO synth-2D]
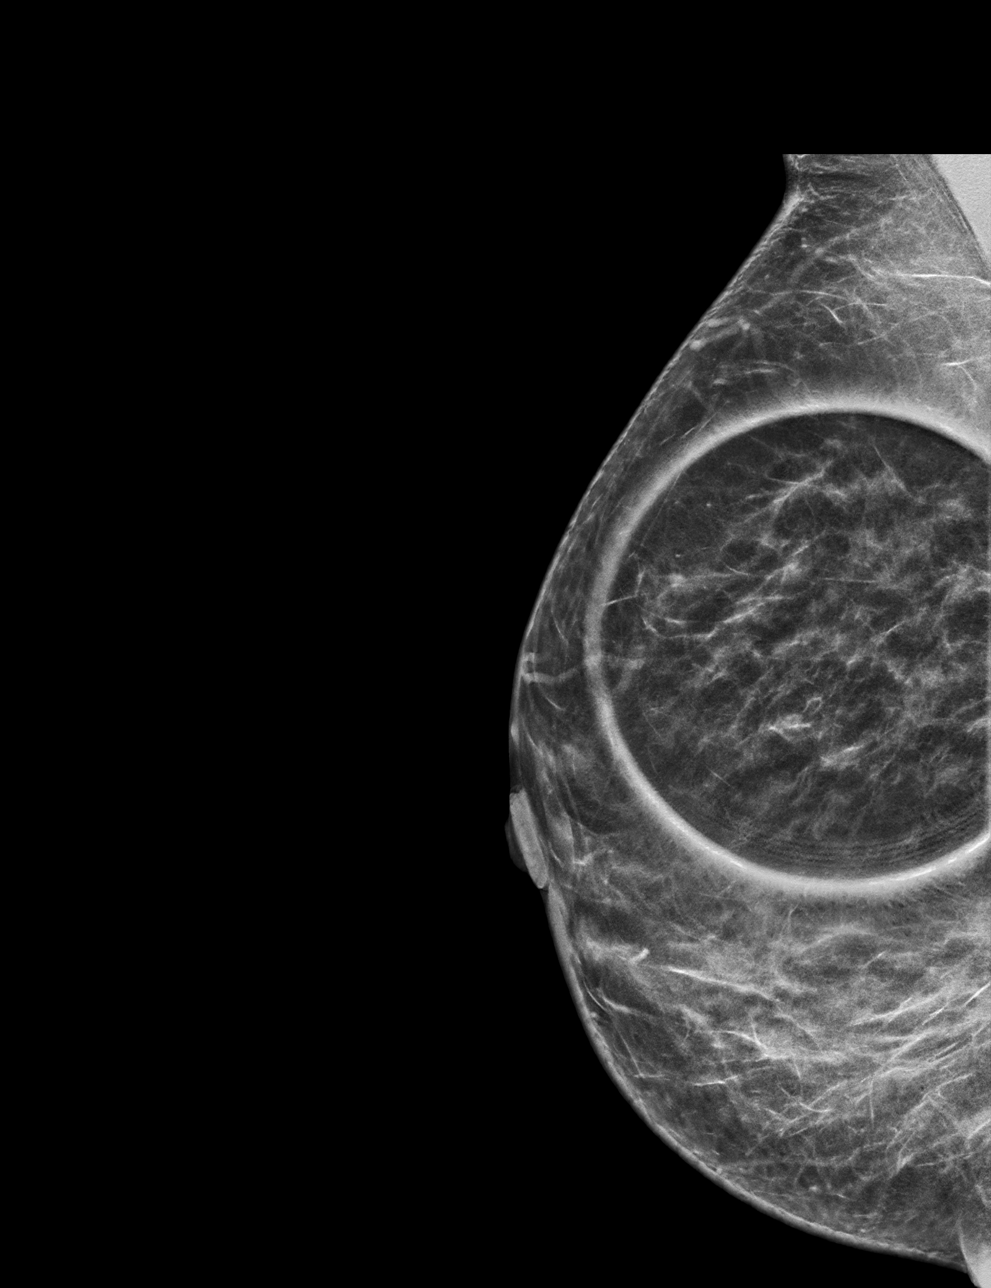

[R MLO tomo · tomo slice 25/50.0]
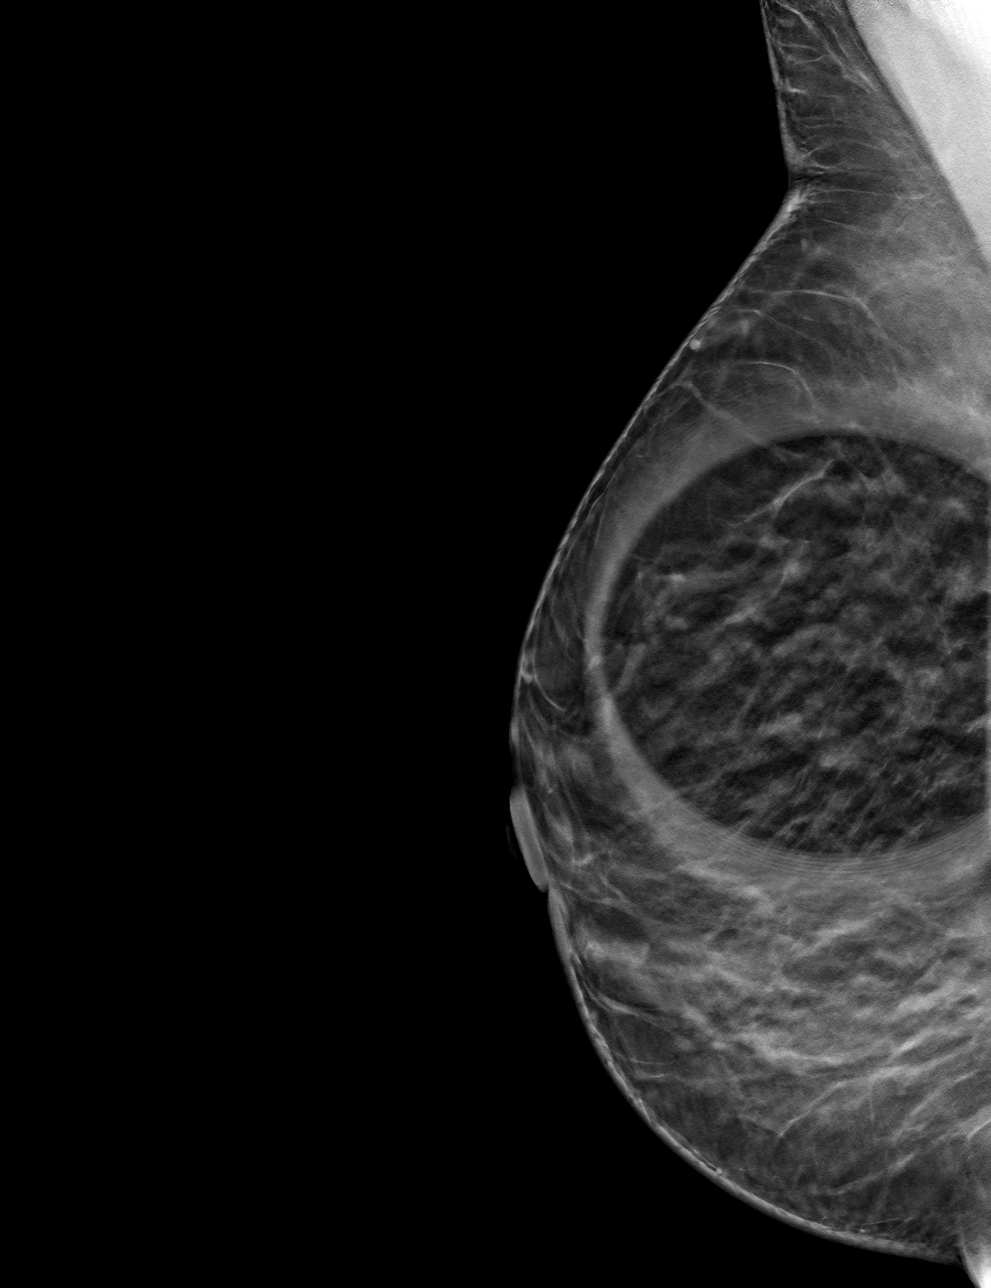

[R CC tomo · tomo slice 27/53.0]
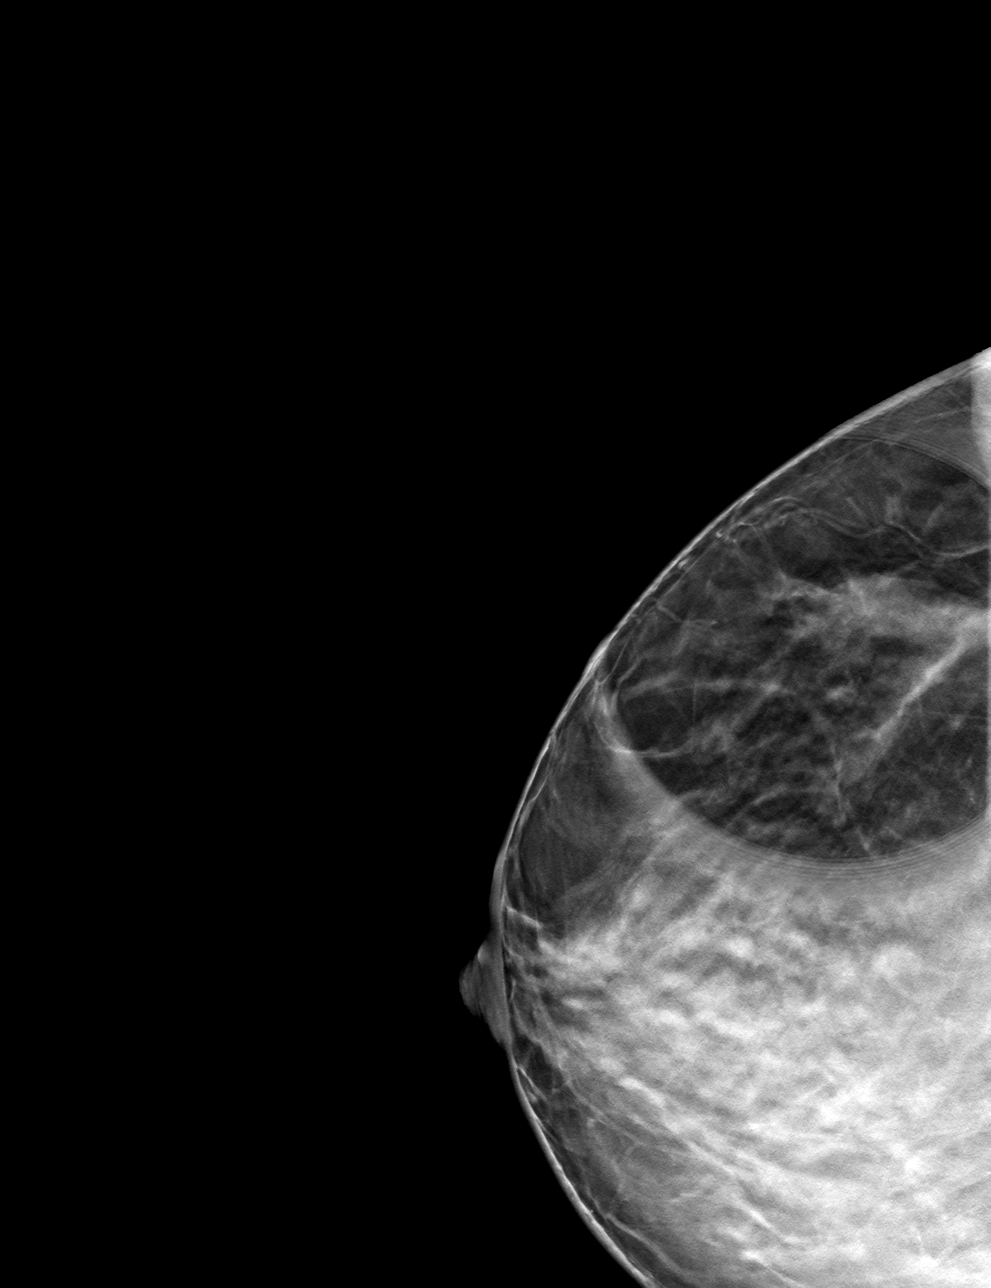

[4 of 12 positions shown; findings below may reference images not displayed]

ACR Breast Density Category c: The breast tissue is heterogeneously
dense, which may obscure small masses.
FINDINGS: Additional 2-D and 3-D images are performed. These view show no
asymmetry in the UPPER-OUTER QUADRANT of the RIGHT breast. No
suspicious mass, distortion, or microcalcifications are identified
to suggest presence of malignancy.

Mammographic images were processed with CAD.
IMPRESSION: No mammographic evidence for malignancy.

RECOMMENDATION:
Screening mammogram in one year.(Code:[id number]-[id number])

I have discussed the findings and recommendations with the patient.
If applicable, a reminder letter will be sent to the patient
regarding the next appointment.

BI-RADS CATEGORY  1: Negative.
